# Patient Record
Sex: Female | Born: 1955 | Race: White | Hispanic: Yes | Marital: Single | Smoking: Never smoker
Health system: Southern US, Community
[De-identification: ages and names within clinical notes are randomized; demographics above are authoritative.]

## PROBLEM LIST (undated history)

## (undated) DIAGNOSIS — I10 Essential (primary) hypertension: Secondary | ICD-10-CM

## (undated) DIAGNOSIS — H409 Unspecified glaucoma: Secondary | ICD-10-CM

## (undated) DIAGNOSIS — Z87898 Personal history of other specified conditions: Secondary | ICD-10-CM

## (undated) DIAGNOSIS — E785 Hyperlipidemia, unspecified: Secondary | ICD-10-CM

## (undated) HISTORY — PX: EYE SURGERY: SHX253

## (undated) HISTORY — DX: Essential (primary) hypertension: I10

## (undated) HISTORY — DX: Hyperlipidemia, unspecified: E78.5

## (undated) HISTORY — PX: OVARIAN CYST REMOVAL: SHX89

## (undated) HISTORY — DX: Personal history of other specified conditions: Z87.898

## (undated) HISTORY — PX: TUBAL LIGATION: SHX77

## (undated) HISTORY — DX: Unspecified glaucoma: H40.9

---

## 1999-09-13 ENCOUNTER — Encounter: Payer: Self-pay | Admitting: Family Medicine

## 1999-09-13 ENCOUNTER — Ambulatory Visit (HOSPITAL_COMMUNITY): Admission: RE | Admit: 1999-09-13 | Discharge: 1999-09-13 | Payer: Self-pay | Admitting: Family Medicine

## 1999-09-16 ENCOUNTER — Ambulatory Visit (HOSPITAL_COMMUNITY): Admission: RE | Admit: 1999-09-16 | Discharge: 1999-09-16 | Payer: Self-pay | Admitting: Family Medicine

## 2001-08-03 ENCOUNTER — Ambulatory Visit: Admission: RE | Admit: 2001-08-03 | Discharge: 2001-08-03 | Payer: Self-pay | Admitting: Emergency Medicine

## 2003-01-04 ENCOUNTER — Ambulatory Visit (HOSPITAL_COMMUNITY): Admission: RE | Admit: 2003-01-04 | Discharge: 2003-01-04 | Payer: Self-pay | Admitting: Family Medicine

## 2003-02-01 ENCOUNTER — Encounter: Payer: Self-pay | Admitting: Family Medicine

## 2003-02-01 ENCOUNTER — Ambulatory Visit (HOSPITAL_COMMUNITY): Admission: RE | Admit: 2003-02-01 | Discharge: 2003-02-01 | Payer: Self-pay | Admitting: Family Medicine

## 2003-06-12 ENCOUNTER — Ambulatory Visit (HOSPITAL_COMMUNITY): Admission: RE | Admit: 2003-06-12 | Discharge: 2003-06-12 | Payer: Self-pay | Admitting: Urology

## 2004-02-16 ENCOUNTER — Ambulatory Visit: Payer: Self-pay | Admitting: *Deleted

## 2004-02-21 ENCOUNTER — Ambulatory Visit: Payer: Self-pay | Admitting: Internal Medicine

## 2004-03-12 ENCOUNTER — Ambulatory Visit: Payer: Self-pay | Admitting: Family Medicine

## 2004-03-22 ENCOUNTER — Ambulatory Visit (HOSPITAL_COMMUNITY): Admission: RE | Admit: 2004-03-22 | Discharge: 2004-03-22 | Payer: Self-pay | Admitting: Family Medicine

## 2004-04-02 ENCOUNTER — Ambulatory Visit: Payer: Self-pay | Admitting: Internal Medicine

## 2004-08-20 ENCOUNTER — Ambulatory Visit: Payer: Self-pay | Admitting: Family Medicine

## 2004-08-22 ENCOUNTER — Ambulatory Visit: Payer: Self-pay | Admitting: Family Medicine

## 2005-01-31 ENCOUNTER — Ambulatory Visit: Payer: Self-pay | Admitting: Family Medicine

## 2005-01-31 ENCOUNTER — Ambulatory Visit (HOSPITAL_COMMUNITY): Admission: RE | Admit: 2005-01-31 | Discharge: 2005-01-31 | Payer: Self-pay | Admitting: Internal Medicine

## 2005-06-09 ENCOUNTER — Ambulatory Visit: Payer: Self-pay | Admitting: Family Medicine

## 2005-07-01 ENCOUNTER — Ambulatory Visit: Payer: Self-pay | Admitting: Family Medicine

## 2005-07-09 ENCOUNTER — Ambulatory Visit: Payer: Self-pay | Admitting: Family Medicine

## 2005-11-11 ENCOUNTER — Ambulatory Visit: Payer: Self-pay | Admitting: Family Medicine

## 2006-03-17 ENCOUNTER — Ambulatory Visit: Payer: Self-pay | Admitting: Family Medicine

## 2006-04-28 ENCOUNTER — Encounter (INDEPENDENT_AMBULATORY_CARE_PROVIDER_SITE_OTHER): Payer: Self-pay | Admitting: Family Medicine

## 2006-04-28 ENCOUNTER — Ambulatory Visit: Payer: Self-pay | Admitting: Family Medicine

## 2006-05-01 ENCOUNTER — Ambulatory Visit (HOSPITAL_COMMUNITY): Admission: RE | Admit: 2006-05-01 | Discharge: 2006-05-01 | Payer: Self-pay | Admitting: Family Medicine

## 2006-06-05 ENCOUNTER — Ambulatory Visit: Payer: Self-pay | Admitting: Internal Medicine

## 2006-06-25 ENCOUNTER — Ambulatory Visit: Payer: Self-pay | Admitting: Internal Medicine

## 2006-08-03 ENCOUNTER — Ambulatory Visit: Payer: Self-pay | Admitting: Family Medicine

## 2007-01-27 ENCOUNTER — Encounter (INDEPENDENT_AMBULATORY_CARE_PROVIDER_SITE_OTHER): Payer: Self-pay | Admitting: *Deleted

## 2007-05-21 ENCOUNTER — Ambulatory Visit: Payer: Self-pay | Admitting: Internal Medicine

## 2007-05-21 DIAGNOSIS — M25549 Pain in joints of unspecified hand: Secondary | ICD-10-CM

## 2007-05-21 DIAGNOSIS — M719 Bursopathy, unspecified: Secondary | ICD-10-CM

## 2007-05-21 DIAGNOSIS — M67919 Unspecified disorder of synovium and tendon, unspecified shoulder: Secondary | ICD-10-CM | POA: Insufficient documentation

## 2007-05-24 ENCOUNTER — Ambulatory Visit (HOSPITAL_COMMUNITY): Admission: RE | Admit: 2007-05-24 | Discharge: 2007-05-24 | Payer: Self-pay | Admitting: Internal Medicine

## 2007-05-24 ENCOUNTER — Ambulatory Visit (HOSPITAL_COMMUNITY): Admission: RE | Admit: 2007-05-24 | Discharge: 2007-05-24 | Payer: Self-pay | Admitting: Family Medicine

## 2007-07-05 ENCOUNTER — Encounter (INDEPENDENT_AMBULATORY_CARE_PROVIDER_SITE_OTHER): Payer: Self-pay | Admitting: Family Medicine

## 2007-07-05 ENCOUNTER — Ambulatory Visit: Payer: Self-pay | Admitting: Family Medicine

## 2007-07-05 DIAGNOSIS — I10 Essential (primary) hypertension: Secondary | ICD-10-CM | POA: Insufficient documentation

## 2007-07-05 LAB — CONVERTED CEMR LAB
AST: 17 units/L (ref 0–37)
Alkaline Phosphatase: 60 units/L (ref 39–117)
BUN: 15 mg/dL (ref 6–23)
Basophils Relative: 0 % (ref 0–1)
Calcium: 8.7 mg/dL (ref 8.4–10.5)
Chloride: 105 meq/L (ref 96–112)
Creatinine, Ser: 0.62 mg/dL (ref 0.40–1.20)
Eosinophils Absolute: 0.1 10*3/uL (ref 0.0–0.7)
Eosinophils Relative: 1 % (ref 0–5)
HDL: 47 mg/dL (ref >39)
Hemoglobin: 12.7 g/dL (ref 12.0–15.0)
MCHC: 32.6 g/dL (ref 30.0–36.0)
MCV: 87.4 fL (ref 78.0–100.0)
Monocytes Absolute: 0.6 10*3/uL (ref 0.1–1.0)
Monocytes Relative: 8 % (ref 3–12)
Pap Smear: NORMAL
RBC: 4.46 M/uL (ref 3.87–5.11)
TSH: 1.225 microintl units/mL (ref 0.350–5.50)
Total CHOL/HDL Ratio: 3.2

## 2008-03-21 ENCOUNTER — Ambulatory Visit: Payer: Self-pay | Admitting: Family Medicine

## 2008-09-25 ENCOUNTER — Ambulatory Visit: Payer: Self-pay | Admitting: *Deleted

## 2008-12-29 ENCOUNTER — Ambulatory Visit: Payer: Self-pay | Admitting: Physician Assistant

## 2008-12-29 DIAGNOSIS — R011 Cardiac murmur, unspecified: Secondary | ICD-10-CM

## 2009-01-04 ENCOUNTER — Ambulatory Visit (HOSPITAL_COMMUNITY): Admission: RE | Admit: 2009-01-04 | Discharge: 2009-01-04 | Payer: Self-pay | Admitting: Internal Medicine

## 2009-01-09 ENCOUNTER — Encounter (INDEPENDENT_AMBULATORY_CARE_PROVIDER_SITE_OTHER): Payer: Self-pay | Admitting: Internal Medicine

## 2009-01-09 ENCOUNTER — Ambulatory Visit (HOSPITAL_COMMUNITY): Admission: RE | Admit: 2009-01-09 | Discharge: 2009-01-09 | Payer: Self-pay | Admitting: Internal Medicine

## 2009-01-12 ENCOUNTER — Ambulatory Visit: Payer: Self-pay | Admitting: Physician Assistant

## 2009-02-02 ENCOUNTER — Ambulatory Visit: Payer: Self-pay | Admitting: Physician Assistant

## 2009-03-02 ENCOUNTER — Ambulatory Visit: Payer: Self-pay | Admitting: Physician Assistant

## 2009-03-02 DIAGNOSIS — R079 Chest pain, unspecified: Secondary | ICD-10-CM

## 2009-03-02 DIAGNOSIS — F329 Major depressive disorder, single episode, unspecified: Secondary | ICD-10-CM | POA: Insufficient documentation

## 2009-03-02 DIAGNOSIS — K219 Gastro-esophageal reflux disease without esophagitis: Secondary | ICD-10-CM

## 2009-03-03 ENCOUNTER — Encounter: Payer: Self-pay | Admitting: Physician Assistant

## 2009-03-06 ENCOUNTER — Ambulatory Visit: Payer: Self-pay | Admitting: Physician Assistant

## 2009-03-09 ENCOUNTER — Encounter: Payer: Self-pay | Admitting: Physician Assistant

## 2009-04-02 ENCOUNTER — Ambulatory Visit: Payer: Self-pay | Admitting: Physician Assistant

## 2009-05-01 ENCOUNTER — Encounter: Payer: Self-pay | Admitting: Cardiovascular Disease

## 2009-05-03 ENCOUNTER — Ambulatory Visit: Payer: Self-pay | Admitting: Physician Assistant

## 2009-05-18 ENCOUNTER — Emergency Department (HOSPITAL_COMMUNITY): Admission: EM | Admit: 2009-05-18 | Discharge: 2009-05-19 | Payer: Self-pay | Admitting: Emergency Medicine

## 2009-05-24 ENCOUNTER — Ambulatory Visit: Payer: Self-pay | Admitting: Physician Assistant

## 2009-05-24 DIAGNOSIS — Z87442 Personal history of urinary calculi: Secondary | ICD-10-CM

## 2009-05-25 ENCOUNTER — Telehealth: Payer: Self-pay | Admitting: Physician Assistant

## 2009-05-25 LAB — CONVERTED CEMR LAB
ALT: 10 units/L (ref 0–35)
AST: 17 units/L (ref 0–37)
CO2: 27 meq/L (ref 19–32)
Chloride: 103 meq/L (ref 96–112)
Sodium: 138 meq/L (ref 135–145)
Total Bilirubin: 0.4 mg/dL (ref 0.3–1.2)
Total Protein: 7 g/dL (ref 6.0–8.3)
Uric Acid, Serum: 4.5 mg/dL (ref 2.4–7.0)

## 2009-05-29 ENCOUNTER — Ambulatory Visit: Payer: Self-pay

## 2009-05-29 ENCOUNTER — Encounter: Payer: Self-pay | Admitting: Cardiovascular Disease

## 2009-05-29 ENCOUNTER — Ambulatory Visit: Payer: Self-pay | Admitting: Cardiology

## 2009-05-29 ENCOUNTER — Ambulatory Visit: Payer: Self-pay | Admitting: Cardiovascular Disease

## 2009-05-29 ENCOUNTER — Ambulatory Visit (HOSPITAL_COMMUNITY): Admission: RE | Admit: 2009-05-29 | Discharge: 2009-05-29 | Payer: Self-pay | Admitting: Cardiovascular Disease

## 2009-06-11 ENCOUNTER — Encounter: Payer: Self-pay | Admitting: Physician Assistant

## 2009-06-11 DIAGNOSIS — I080 Rheumatic disorders of both mitral and aortic valves: Secondary | ICD-10-CM | POA: Insufficient documentation

## 2009-06-14 ENCOUNTER — Ambulatory Visit: Payer: Self-pay | Admitting: Physician Assistant

## 2009-06-14 LAB — CONVERTED CEMR LAB
Ketones, urine, test strip: NEGATIVE
Nitrite: NEGATIVE
Urobilinogen, UA: 0.2
WBC Urine, dipstick: NEGATIVE

## 2009-06-15 ENCOUNTER — Encounter: Payer: Self-pay | Admitting: Physician Assistant

## 2009-06-15 LAB — CONVERTED CEMR LAB
Bacteria, UA: NONE SEEN
WBC, UA: NONE SEEN cells/hpf (ref <3)

## 2009-07-02 LAB — CONVERTED CEMR LAB
Creatinine 24 HR UR: 1244 mg/24hr (ref 700–1800)
Creatinine, Urine: 124.4 mg/dL
Uric Acid, 24H Ur: 604 mg/(24.h) (ref 250–750)

## 2009-07-06 ENCOUNTER — Ambulatory Visit: Payer: Self-pay | Admitting: Physician Assistant

## 2009-07-13 ENCOUNTER — Ambulatory Visit: Payer: Self-pay | Admitting: Physician Assistant

## 2009-07-13 LAB — CONVERTED CEMR LAB
Bacteria, UA: NONE SEEN
RBC / HPF: NONE SEEN (ref <3)

## 2009-07-14 ENCOUNTER — Encounter: Payer: Self-pay | Admitting: Physician Assistant

## 2009-07-16 ENCOUNTER — Encounter: Payer: Self-pay | Admitting: Physician Assistant

## 2009-07-16 LAB — CONVERTED CEMR LAB
Glucose, Urine, Semiquant: NEGATIVE
Nitrite: NEGATIVE
Protein, U semiquant: NEGATIVE
Specific Gravity, Urine: 1.025
Urobilinogen, UA: 0.2
WBC Urine, dipstick: NEGATIVE

## 2009-07-17 ENCOUNTER — Encounter (INDEPENDENT_AMBULATORY_CARE_PROVIDER_SITE_OTHER): Payer: Self-pay | Admitting: *Deleted

## 2009-08-03 ENCOUNTER — Ambulatory Visit: Payer: Self-pay | Admitting: Physician Assistant

## 2009-09-17 ENCOUNTER — Encounter: Payer: Self-pay | Admitting: Physician Assistant

## 2009-09-17 ENCOUNTER — Ambulatory Visit: Payer: Self-pay | Admitting: Physician Assistant

## 2009-09-17 DIAGNOSIS — J309 Allergic rhinitis, unspecified: Secondary | ICD-10-CM | POA: Insufficient documentation

## 2009-09-17 LAB — CONVERTED CEMR LAB
Bilirubin Urine: NEGATIVE
Glucose, Urine, Semiquant: NEGATIVE
Ketones, urine, test strip: NEGATIVE
Nitrite: NEGATIVE
pH: 5

## 2009-09-18 DIAGNOSIS — E876 Hypokalemia: Secondary | ICD-10-CM

## 2009-09-18 LAB — CONVERTED CEMR LAB
CO2: 21 meq/L (ref 19–32)
Calcium: 9.3 mg/dL (ref 8.4–10.5)
Casts: NONE SEEN /lpf
Glucose, Bld: 89 mg/dL (ref 70–99)
Potassium: 3.4 meq/L — ABNORMAL LOW (ref 3.5–5.3)
RBC / HPF: NONE SEEN (ref <3)
Sodium: 136 meq/L (ref 135–145)
WBC, UA: NONE SEEN cells/hpf (ref <3)

## 2009-10-04 ENCOUNTER — Ambulatory Visit: Payer: Self-pay | Admitting: Physician Assistant

## 2009-10-09 ENCOUNTER — Encounter: Payer: Self-pay | Admitting: Physician Assistant

## 2009-10-09 LAB — CONVERTED CEMR LAB
BUN: 21 mg/dL (ref 6–23)
Calcium: 8.5 mg/dL (ref 8.4–10.5)
Glucose, Bld: 100 mg/dL — ABNORMAL HIGH (ref 70–99)
Potassium: 4.6 meq/L (ref 3.5–5.3)
Sodium: 138 meq/L (ref 135–145)

## 2009-10-15 ENCOUNTER — Encounter: Payer: Self-pay | Admitting: Physician Assistant

## 2009-12-28 ENCOUNTER — Encounter (INDEPENDENT_AMBULATORY_CARE_PROVIDER_SITE_OTHER): Payer: Self-pay | Admitting: *Deleted

## 2009-12-30 ENCOUNTER — Telehealth: Payer: Self-pay | Admitting: Physician Assistant

## 2010-01-28 ENCOUNTER — Telehealth: Payer: Self-pay | Admitting: Physician Assistant

## 2010-02-08 ENCOUNTER — Ambulatory Visit: Payer: Self-pay | Admitting: Physician Assistant

## 2010-02-08 ENCOUNTER — Ambulatory Visit (HOSPITAL_COMMUNITY): Admission: RE | Admit: 2010-02-08 | Discharge: 2010-02-08 | Payer: Self-pay | Admitting: Internal Medicine

## 2010-02-13 ENCOUNTER — Ambulatory Visit (HOSPITAL_COMMUNITY): Admission: RE | Admit: 2010-02-13 | Discharge: 2010-02-13 | Payer: Self-pay | Admitting: Gastroenterology

## 2010-04-22 ENCOUNTER — Encounter (INDEPENDENT_AMBULATORY_CARE_PROVIDER_SITE_OTHER): Payer: Self-pay | Admitting: Nurse Practitioner

## 2010-04-22 ENCOUNTER — Ambulatory Visit: Payer: Self-pay | Admitting: Nurse Practitioner

## 2010-04-22 LAB — CONVERTED CEMR LAB
Bilirubin Urine: NEGATIVE
Glucose, Urine, Semiquant: NEGATIVE
KOH Prep: NEGATIVE
Ketones, urine, test strip: NEGATIVE
Protein, U semiquant: NEGATIVE
Specific Gravity, Urine: 1.02
WBC Urine, dipstick: NEGATIVE
pH: 6

## 2010-04-23 LAB — CONVERTED CEMR LAB
ALT: 13 units/L (ref 0–35)
Albumin: 4 g/dL (ref 3.5–5.2)
Alkaline Phosphatase: 49 units/L (ref 39–117)
Basophils Absolute: 0 10*3/uL (ref 0.0–0.1)
CO2: 27 meq/L (ref 19–32)
Chlamydia, DNA Probe: NEGATIVE
Cholesterol: 188 mg/dL (ref 0–200)
Eosinophils Relative: 2 % (ref 0–5)
GC Probe Amp, Genital: NEGATIVE
Glucose, Bld: 89 mg/dL (ref 70–99)
LDL Cholesterol: 115 mg/dL — ABNORMAL HIGH (ref 0–99)
Lymphocytes Relative: 31 % (ref 12–46)
Lymphs Abs: 2 10*3/uL (ref 0.7–4.0)
Microalb, Ur: 2.54 mg/dL — ABNORMAL HIGH (ref 0.00–1.89)
Neutrophils Relative %: 61 % (ref 43–77)
Platelets: 209 10*3/uL (ref 150–400)
Potassium: 3.7 meq/L (ref 3.5–5.3)
RDW: 14.3 % (ref 11.5–15.5)
Sodium: 141 meq/L (ref 135–145)
Total Bilirubin: 0.6 mg/dL (ref 0.3–1.2)
Total Protein: 7 g/dL (ref 6.0–8.3)
Triglycerides: 134 mg/dL (ref <150)
VLDL: 27 mg/dL (ref 0–40)
WBC: 6.6 10*3/uL (ref 4.0–10.5)

## 2010-04-25 ENCOUNTER — Encounter (INDEPENDENT_AMBULATORY_CARE_PROVIDER_SITE_OTHER): Payer: Self-pay | Admitting: *Deleted

## 2010-04-25 ENCOUNTER — Encounter (INDEPENDENT_AMBULATORY_CARE_PROVIDER_SITE_OTHER): Payer: Self-pay | Admitting: Nurse Practitioner

## 2010-04-25 LAB — CONVERTED CEMR LAB: Pap Smear: NEGATIVE

## 2010-05-03 ENCOUNTER — Encounter (INDEPENDENT_AMBULATORY_CARE_PROVIDER_SITE_OTHER): Payer: Self-pay | Admitting: *Deleted

## 2010-06-09 LAB — CONVERTED CEMR LAB
AST: 17 units/L (ref 0–37)
Alkaline Phosphatase: 48 units/L (ref 39–117)
BUN: 18 mg/dL (ref 6–23)
Basophils Absolute: 0 10*3/uL (ref 0.0–0.1)
Basophils Relative: 0 % (ref 0–1)
Bilirubin Urine: NEGATIVE
CO2: 25 meq/L (ref 19–32)
Calcium: 8.7 mg/dL (ref 8.4–10.5)
Calcium: 9.2 mg/dL (ref 8.4–10.5)
Creatinine, Ser: 0.6 mg/dL (ref 0.40–1.20)
Creatinine, Ser: 0.68 mg/dL (ref 0.40–1.20)
Eosinophils Absolute: 0.1 10*3/uL (ref 0.0–0.7)
Eosinophils Relative: 1 % (ref 0–5)
GC Probe Amp, Genital: NEGATIVE
Glucose, Bld: 76 mg/dL (ref 70–99)
HCT: 37.2 % (ref 36.0–46.0)
MCHC: 32.8 g/dL (ref 30.0–36.0)
MCV: 88.2 fL (ref 78.0–100.0)
Nitrite: NEGATIVE
Platelets: 242 10*3/uL (ref 150–400)
RDW: 14.5 % (ref 11.5–15.5)
Specific Gravity, Urine: >=1.03
Urobilinogen, UA: 0.2
WBC Urine, dipstick: NEGATIVE
pH: 5

## 2010-06-11 NOTE — Assessment & Plan Note (Signed)
Summary: HTN; Depression; GERD   Vital Signs:  Patient profile:   55 year old female Height:      59.25 inches Weight:      150 pounds BMI:     30.15 Temp:     97.0 degrees F oral Pulse rate:   72 / minute Pulse rhythm:   regular Resp:     18 per minute BP sitting:   100 / 60  (left arm) Cuff size:   large  Vitals Entered By: [name] (February 08, 2010 11:08 AM) CC: meds review.... med refill, Hypertension Management, Abdominal Pain, Depression Is Patient Diabetic? No Pain Assessment Patient in pain? no       Does patient need assistance? Functional Status Self care Ambulation Normal   CC:  meds review.... med refill, Hypertension Management, Abdominal Pain, and Depression.  Depression History:      The patient comes in today for a maintenance visit due to her depression.  The patient denies a depressed mood most of the day and a diminished interest in her usual daily activities.  The patient denies recurrent thoughts of death or suicide.         Dyspepsia History:      She has no alarm features of dyspepsia including no history of melena, hematochezia, dysphagia, persistent vomiting, or involuntary weight loss > 5%.  There is a prior history of GERD.  An H-2 blocker medication is currently being taken.    Hypertension History:      She complains of headache, but denies chest pain, dyspnea with exertion, and syncope.  She notes no problems with any antihypertensive medication side effects.  Further comments include: Patient still taking cardura.  Had stated she stopped a long time ago, but still taking. BP ok without SEs.        Positive major cardiovascular risk factors include hypertension.  Negative major cardiovascular risk factors include female age less than 78 years old and non-tobacco-user status.      Current Medications (verified): 1)  Benazepril-Hydrochlorothiazide 20-25 Mg  Tabs (Benazepril-Hydrochlorothiazide) .Marland Kitchen.. 1 Tab By Mouth Every Morning 2)   Ibuprofen 600 Mg  Tabs (Ibuprofen) .Marland Kitchen.. 1 Tab By Mouth Three Times A Day With Meals As Needed. 3)  Pepcid 20 Mg Tabs (Famotidine) .... Take 1 Tablet By Mouth Two Times A Day For Indigestion 4)  Caltrate 600+d Plus 600-400 Mg-Unit Tabs (Calcium Carbonate-Vit D-Min) .... Take 1 Tablet By Mouth Two Times A Day 5)  Nitrostat 0.4 Mg Subl (Nitroglycerin) .Marland Kitchen.. 1 Sublingual As Needed For Chest Pain.  Repeat Q 5 Mins. As Need.  Call 911 Before 3 Rd Dose.  Sit Down To Take. 6)  Aspir-Low 81 Mg Tbec (Aspirin) .... Once Daily 7)  Calan Sr 240 Mg Cr-Tabs (Verapamil Hcl) .... Take 1 Tablet By Mouth Once A Day 8)  Oxycodone-Acetaminophen 5-325 Mg Tabs (Oxycodone-Acetaminophen) .... One or Two Tabs As Needed For Pain 9)  Visine 0.05 % Soln (Tetrahydrozoline Hcl) .Marland Kitchen.. 1-2 Drops in Each Eye Every 6-8 Hours As Needed 10)  Miralax  Powd (Polyethylene Glycol 3350) .... As Directed For Colonoscopy Prep  Allergies (verified): No Known Drug Allergies  Physical Exam  General:  alert, well-developed, and well-nourished.   Head:  normocephalic and atraumatic.   Neck:  supple.   Lungs:  normal breath sounds.   Heart:  normal rate and regular rhythm.   Abdomen:  soft and non-tender.   Neurologic:  alert & oriented X3 and cranial nerves II-XII intact.  Psych:  normally interactive.     Impression & Recommendations:  Problem # 1:  HYPERTENSION (ICD-401.9) controlled  Her updated medication list for this problem includes:    Benazepril-hydrochlorothiazide 20-25 Mg Tabs (Benazepril-hydrochlorothiazide) .Marland Kitchen... 1 tab by mouth every morning    Calan Sr 240 Mg Cr-tabs (Verapamil hcl) .Marland Kitchen... Take 1 tablet by mouth once a day    Cardura 2 Mg Tabs (Doxazosin mesylate) .Marland Kitchen... Take 1 tablet by mouth once a day  Problem # 2:  MITRAL REGURGITATION, MILD (ICD-396.3) needs repeat echo in 05/2010  Her updated medication list for this problem includes:    Aspir-low 81 Mg Tbec (Aspirin) ..... Once daily  Problem # 3:   DEPRESSION, MILD (ICD-311) Assessment: Improved not taking zoloft as directed rec she stop altogether  Problem # 4:  GERD (ICD-530.81) stable  Her updated medication list for this problem includes:    Pepcid 20 Mg Tabs (Famotidine) .Marland Kitchen... Take 1 tablet by mouth two times a day for indigestion  Problem # 5:  EXAMINATION, ROUTINE MEDICAL (ICD-V70.0) has appt for colo next week   Complete Medication List: 1)  Benazepril-hydrochlorothiazide 20-25 Mg Tabs (Benazepril-hydrochlorothiazide) .Marland Kitchen.. 1 tab by mouth every morning 2)  Ibuprofen 600 Mg Tabs (Ibuprofen) .Marland Kitchen.. 1 tab by mouth three times a day with meals as needed. 3)  Pepcid 20 Mg Tabs (Famotidine) .... Take 1 tablet by mouth two times a day for indigestion 4)  Caltrate 600+d Plus 600-400 Mg-unit Tabs (Calcium carbonate-vit d-min) .... Take 1 tablet by mouth two times a day 5)  Nitrostat 0.4 Mg Subl (Nitroglycerin) .Marland Kitchen.. 1 sublingual as needed for chest pain.  repeat q 5 mins. as need.  call 911 before 3 rd dose.  sit down to take. 6)  Aspir-low 81 Mg Tbec (Aspirin) .... Once daily 7)  Calan Sr 240 Mg Cr-tabs (Verapamil hcl) .... Take 1 tablet by mouth once a day 8)  Visine 0.05 % Soln (Tetrahydrozoline hcl) .Marland Kitchen.. 1-2 drops in each eye every 6-8 hours as needed 9)  Miralax Powd (Polyethylene glycol 3350) .... As directed for colonoscopy prep 10)  Cardura 2 Mg Tabs (Doxazosin mesylate) .... Take 1 tablet by mouth once a day  Depression Assessment/Plan: Assessment of Depressive Symptoms:  Complete Response Treatment Comments:  Only taking zoloft as needed.  Not taking very much.  Advised her to stop it altogether.  Hypertension Assessment/Plan:      The patient's hypertensive risk group is category A: No risk factors and no target organ damage.  Her calculated 10 year risk of coronary heart disease is 3 %.  Today's blood pressure is 100/60.  Her blood pressure goal is < 140/90.   Patient Instructions: 1)  Please schedule a follow-up  appointment in 2-3 months for CPP.

## 2010-06-11 NOTE — Progress Notes (Signed)
Summary: REFILLS on Benazepril and Calan SR  Phone Note Call from Patient Call back at Home Phone [phone]   Reason for Call: Refill Medication Summary of Call: [facility] PT. PATIENT NEEDS REFILLS  ON BENAZEPRIL AND CALAN SR. SHE USES [facility]. Initial call taken by: Leodis Rains,  January 28, 2010 3:53 PM  Follow-up for Phone Call        Do you want her Benazepril and Calan SR refilled?  Last seen 09/17/09. Follow-up by: Dutch Quint RN,  January 29, 2010 10:07 AM  Additional Follow-up for Phone Call Additional follow up Details #1::        Pt. notified of refills.  Dutch Quint RN  January 29, 2010 2:09 PM     Prescriptions: CALAN SR 240 MG CR-TABS (VERAPAMIL HCL) Take 1 tablet by mouth once a day  #30 x 6   Entered and Authorized by:   Tereso Newcomer PA-C   Signed by:   Tereso Newcomer PA-C on 01/29/2010   Method used:   Faxed to ...       [facility] (retail)       [address] [place], [place]  [address]       Ph: [phone]       Fax: [phone]   RxID:   [id number] BENAZEPRIL-HYDROCHLOROTHIAZIDE 20-25 MG  TABS (BENAZEPRIL-HYDROCHLOROTHIAZIDE) 1 tab by mouth every morning  #30 x 6   Entered and Authorized by:   Tereso Newcomer PA-C   Signed by:   Tereso Newcomer PA-C on 01/29/2010   Method used:   Faxed to ...       [facility] (retail)       [address] [place], [place]  [address]       Ph: [phone]       Fax: [phone]   RxID:   [id number]

## 2010-06-11 NOTE — Progress Notes (Signed)
Summary: Office Visit//DEPRESSION SCREENING  Office Visit//DEPRESSION SCREENING   Imported By: Arta Bruce 11/13/2009 15:24:02  _____________________________________________________________________  External Attachment:    Type:   Image     Comment:   External Document

## 2010-06-11 NOTE — Progress Notes (Signed)
Summary: Colonoscopy Prescription   Phone Note Outgoing Call   Summary of Call: [facility] could not contact Susan Russell. Please get in touch with her. See if we can get her in with Dr. Doreatha Martin for screening colo. Initial call taken by: Brynda Rim,  December 30, 2009 10:02 PM  Follow-up for Phone Call        Left message on answering machine for pt to call back..[name]  December 31, 2009 5:06 PM  Will forward to Arna Medici to contact Pt. Gaylyn Cheers RN  January 01, 2010 10:56 AM    Additional Follow-up for Phone Call Additional follow up Details #1::        I spoke with pt today and I schedule her an appt with Dr Victorino Dike 02-06-10 @ 1pm Pt aware of the appt  She is going to  pass by here to pick up RX  Additional Follow-up by: Cheryll Dessert,  January 02, 2010 4:38 PM    Additional Follow-up for Phone Call Additional follow up Details #2::    in [place]'s basket Tereso Newcomer PA-C  January 04, 2010 2:34 PM   sent script to according.... [name]  January 08, 2010 9:20 AM   New/Updated Medications: MIRALAX  POWD (POLYETHYLENE GLYCOL 3350) as directed for colonoscopy prep Prescriptions: MIRALAX  POWD (POLYETHYLENE GLYCOL 3350) as directed for colonoscopy prep  #1 bottle x 0   Entered and Authorized by:   Tereso Newcomer PA-C   Signed by:   Tereso Newcomer PA-C on 01/04/2010   Method used:   Print then Give to Patient   RxID:   [id number]

## 2010-06-11 NOTE — Progress Notes (Signed)
  Phone Note Outgoing Call   Summary of Call: Please call patient to confirm that she is taking both verapamil and norvasc. If so, tell her to stop the norvasc.  I spoke with the heart doctor.  He did not want her to be on both meds. She should stay on the verapamil. Initial call taken by: Tereso Newcomer PA-C,  May 25, 2009 1:51 PM  Follow-up for Phone Call        graceila Left message on answering machine for pt to call back...Marland KitchenMarland KitchenArmenia Shannon  May 25, 2009 3:38 PM   Thanks. Follow-up by: Tereso Newcomer PA-C,  May 29, 2009 1:51 PM  Additional Follow-up for Phone Call Additional follow up Details #1::        spoke with pt and she will stop taking med today Additional Follow-up by: [name],  June 04, 2009 2:48 PM

## 2010-06-11 NOTE — Miscellaneous (Signed)
Summary: Needs repeat Echo 05/2010  Clinical Lists Changes  Problems: Added new problem of MITRAL REGURGITATION, MILD (ICD-396.3) Assessed MITRAL REGURGITATION, MILD as comment only - needs f/u Echo in 1 year; q 2-3 years after  if no change  Her updated medication list for this problem includes:    Aspir-low 81 Mg Tbec (Aspirin) ..... Once daily  Observations: Added new observation of PAST MED HX: CHEST PAIN UNSPECIFIED (ICD-786.50) HYPERTENSION (ICD-401.9) MURMUR (ICD-785.2)   a.  mild MR/AI . Marland Kitchen Marland Kitchen needs repeat 05/2010 DEPRESSION, MILD (ICD-311) GERD (ICD-530.81) ROTATOR CUFF SYNDROME, LEFT (ICD-726.10) PAIN IN JOINT, HAND (ICD-719.44) EXAMINATION, ROUTINE MEDICAL (ICD-V70.0) SCREENING FOR MALIGNANT NEOPLASM, CERVIX (ICD-V76.2)    (06/11/2009 15:56)       Past History:  Past Medical History: CHEST PAIN UNSPECIFIED (ICD-786.50) HYPERTENSION (ICD-401.9) MURMUR (ICD-785.2)   a.  mild MR/AI . Marland Kitchen Marland Kitchen needs repeat 05/2010 DEPRESSION, MILD (ICD-311) GERD (ICD-530.81) ROTATOR CUFF SYNDROME, LEFT (ICD-726.10) PAIN IN JOINT, HAND (ICD-719.44) EXAMINATION, ROUTINE MEDICAL (ICD-V70.0) SCREENING FOR MALIGNANT NEOPLASM, CERVIX (ICD-V76.2)     Impression & Recommendations:  Problem # 1:  MITRAL REGURGITATION, MILD (ICD-396.3) needs f/u Echo in 1 year; q 2-3 years after  if no change  Her updated medication list for this problem includes:    Aspir-low 81 Mg Tbec (Aspirin) ..... Once daily  Complete Medication List: 1)  Benazepril-hydrochlorothiazide 20-25 Mg Tabs (Benazepril-hydrochlorothiazide) .Marland Kitchen.. 1 tab by mouth every morning 2)  Ibuprofen 600 Mg Tabs (Ibuprofen) .Marland Kitchen.. 1 tab by mouth three times a day with meals as needed. 3)  Pepcid 20 Mg Tabs (Famotidine) .... Take 1 tablet by mouth two times a day for indigestion 4)  Caltrate 600+d Plus 600-400 Mg-unit Tabs (Calcium carbonate-vit d-min) .... Take 1 tablet by mouth two times a day 5)  Nitrostat 0.4 Mg Subl (Nitroglycerin)  .Marland Kitchen.. 1 sublingual as needed for chest pain.  repeat q 5 mins. as need.  call 911 before 3 rd dose.  sit down to take. 6)  Aspir-low 81 Mg Tbec (Aspirin) .... Once daily 7)  Zoloft 50 Mg Tabs (Sertraline hcl) .... Take 1/2 at bedtime 8)  Calan Sr 240 Mg Cr-tabs (Verapamil hcl) .... Take 1 tablet by mouth once a day 9)  Oxycodone-acetaminophen 5-325 Mg Tabs (Oxycodone-acetaminophen) .... One or two tabs as needed for pain 10)  Ciprofloxacin Hcl 500 Mg Tabs (Ciprofloxacin hcl) 11)  Ondansetron Hcl 4 Mg Tabs (Ondansetron hcl) 12)  Clonidine Hcl 0.2 Mg Tabs (Clonidine hcl) .... Take 1 tablet by mouth two times a day 13)  Cardura 2 Mg Tabs (Doxazosin mesylate) .... Take 1 tab by mouth at bedtime

## 2010-06-11 NOTE — Assessment & Plan Note (Signed)
Summary: f/u stress echo done at 8:30   Visit Type:  Follow-up Primary Provider:  Tereso Newcomer PA-C  CC:  pt had stress echo today...pt had some chest discomfort while on treadmill today.. little sob..no edema.  History of Present Illness: 55 yo Hispanic female with h/o HTN here today for follow up. She was seen several weeks ago for evaluation of chest pain.  She told me  that sometimes when working she feels a heaviness in her chest that lasts five minutes. No associated SOB, dizziness, near syncope, syncope, diaphoresis. Has been occuring for last three months. She had an echo in August 2010 at [facility] that showed normal LV systolic function with diastolic dysfunction. She has had problems over the last few years with HTN and was and has been followed by Tereso Newcomer at [facility].   She is here for follow up and tells me that she has had no recurrence of chest pain. Communication is through an interpretor. Her son is also present with her today. She underwent a stress echo this am and had no ischemic ekg changes and no sign of ischemia on stress images. No other complaints.    Current Medications (verified): 1)  Benazepril-Hydrochlorothiazide 20-25 Mg  Tabs (Benazepril-Hydrochlorothiazide) .Marland Kitchen.. 1 Tab By Mouth Every Morning 2)  Ibuprofen 600 Mg  Tabs (Ibuprofen) .Marland Kitchen.. 1 Tab By Mouth Three Times A Day With Meals As Needed. 3)  Pepcid 20 Mg Tabs (Famotidine) .... Take 1 Tablet By Mouth Two Times A Day For Indigestion 4)  Caltrate 600+d Plus 600-400 Mg-Unit Tabs (Calcium Carbonate-Vit D-Min) .... Take 1 Tablet By Mouth Two Times A Day 5)  Nitrostat 0.4 Mg Subl (Nitroglycerin) .Marland Kitchen.. 1 Sublingual As Needed For Chest Pain.  Repeat Q 5 Mins. As Need.  Call 911 Before 3 Rd Dose.  Sit Down To Take. 6)  Aspir-Low 81 Mg Tbec (Aspirin) .... Once Daily 7)  Zoloft 50 Mg Tabs (Sertraline Hcl) .... Take 1/2 At Bedtime 8)  Calan Sr 240 Mg Cr-Tabs (Verapamil Hcl) .... Take 1 Tablet By Mouth Once A  Day 9)  Amlodipine Besylate 10 Mg Tabs (Amlodipine Besylate) .... Take One Tablet By Mouth Daily 10)  Oxycodone-Acetaminophen 5-325 Mg Tabs (Oxycodone-Acetaminophen) .... One or Two Tabs As Needed For Pain 11)  Ciprofloxacin Hcl 500 Mg Tabs (Ciprofloxacin Hcl) 12)  Ondansetron Hcl 4 Mg Tabs (Ondansetron Hcl) 13)  Clonidine Hcl 0.2 Mg Tabs (Clonidine Hcl) .... Take 1 Tablet By Mouth Two Times A Day 14)  Cardura 2 Mg Tabs (Doxazosin Mesylate) .... Take 1 Tab By Mouth At Bedtime  Allergies (verified): No Known Drug Allergies  Past History:  Past Medical History: Reviewed history from 04/27/2009 and no changes required. CHEST PAIN UNSPECIFIED (ICD-786.50) HYPERTENSION (ICD-401.9) MURMUR (ICD-785.2) DEPRESSION, MILD (ICD-311) GERD (ICD-530.81) ROTATOR CUFF SYNDROME, LEFT (ICD-726.10) PAIN IN JOINT, HAND (ICD-719.44) EXAMINATION, ROUTINE MEDICAL (ICD-V70.0) SCREENING FOR MALIGNANT NEOPLASM, CERVIX (ICD-V76.2)    Social History: Reviewed history from 07/05/2007 and no changes required. Single Has sexual partner x 2 years  Occupation:housekeeping for businesses  Former Smoker(back in 20's) Alcohol use-no Drug use-no  Review of Systems  The patient denies fatigue, malaise, fever, weight gain/loss, vision loss, decreased hearing, hoarseness, chest pain, palpitations, shortness of breath, prolonged cough, wheezing, sleep apnea, coughing up blood, abdominal pain, blood in stool, nausea, vomiting, diarrhea, heartburn, incontinence, blood in urine, muscle weakness, joint pain, leg swelling, rash, skin lesions, headache, fainting, dizziness, depression, anxiety, enlarged lymph nodes, easy bruising or bleeding, and environmental allergies.  Vital Signs:  Patient profile:   55 year old female Height:      59.25 inches Weight:      151 pounds Pulse rate:   66 / minute Pulse rhythm:   regular BP sitting:   114 / 80  (left arm) Cuff size:   large  Vitals Entered By: Danielle Rankin, CMA  (May 29, 2009 9:51 AM)  Physical Exam  General:  General: Well developed, well nourished, NAD Psychiatric: Mood and affect normal Neck: No JVD, no carotid bruits, no thyromegaly, no lymphadenopathy. Lungs:Clear bilaterally, no wheezes, rhonci, crackles CV: RRR no murmurs, gallops rubs Abdomen: soft, NT, ND, BS present Extremities: No edema, pulses 2+.    Stress Echocardiogram  Procedure date:  05/29/2009  Findings:      Pt exercised for 6 min 24 seconds. Target heart rate was achieved. No ischemic EKG changes. Rest EF of 60%. Stress EF 75%. No WMA with exercise.   Normal Stress Echo  Impression & Recommendations:  Problem # 1:  CHEST PAIN UNSPECIFIED (ICD-786.50) No evidence of ischemia on stress echo. Mostl likely non-cardiac chest pain. No further cardiac workup at this time. She will need repeat echo in one year to follow the mild MR/AI. If no change, then echo every 2-3 years. We will ask that this be arranged through the [facility] clinic. I will see her back as needed.   The following medications were removed from the medication list:    Amlodipine Besylate 10 Mg Tabs (Amlodipine besylate) .Marland Kitchen... Take one tablet by mouth daily Her updated medication list for this problem includes:    Benazepril-hydrochlorothiazide 20-25 Mg Tabs (Benazepril-hydrochlorothiazide) .Marland Kitchen... 1 tab by mouth every morning    Nitrostat 0.4 Mg Subl (Nitroglycerin) .Marland Kitchen... 1 sublingual as needed for chest pain.  repeat q 5 mins. as need.  call 911 before 3 rd dose.  sit down to take.    Aspir-low 81 Mg Tbec (Aspirin) ..... Once daily    Calan Sr 240 Mg Cr-tabs (Verapamil hcl) .Marland Kitchen... Take 1 tablet by mouth once a day  Problem # 2:  HYPERTENSION (ICD-401.9) Will have her stop Norvasc. Please see phone notes. She is also on Calan. Will continue Calan.   The following medications were removed from the medication list:    Amlodipine Besylate 10 Mg Tabs (Amlodipine besylate) .Marland Kitchen... Take one tablet by mouth  daily Her updated medication list for this problem includes:    Benazepril-hydrochlorothiazide 20-25 Mg Tabs (Benazepril-hydrochlorothiazide) .Marland Kitchen... 1 tab by mouth every morning    Aspir-low 81 Mg Tbec (Aspirin) ..... Once daily    Calan Sr 240 Mg Cr-tabs (Verapamil hcl) .Marland Kitchen... Take 1 tablet by mouth once a day    Clonidine Hcl 0.2 Mg Tabs (Clonidine hcl) .Marland Kitchen... Take 1 tablet by mouth two times a day    Cardura 2 Mg Tabs (Doxazosin mesylate) .Marland Kitchen... Take 1 tab by mouth at bedtime  Patient Instructions: 1)  Your physician recommends that you schedule a follow-up appointment as needed 2)  Your physician has recommended you make the following change in your medication: Stop norvasc (amlodipine)

## 2010-06-11 NOTE — Assessment & Plan Note (Signed)
Summary: folow up ap in 3 weeks for kidney stones/gk   Vital Signs:  Patient profile:   55 year old female Height:      59.25 inches Weight:      148 pounds BMI:     29.75 Temp:     97.4 degrees F oral Pulse rate:   69 / minute Pulse rhythm:   regular Resp:     18 per minute BP sitting:   146 / 80  (left arm) Cuff size:   large  Vitals Entered By: [name] (June 14, 2009 10:32 AM)  Serial Vital Signs/Assessments:  Time      Position  BP       Pulse  Resp  Temp     By 11:13 AM            120/70                         Tereso Newcomer PA-C 11:14 AM            130/70                         Tereso Newcomer PA-C  Comments: right arm By: Tereso Newcomer PA-C  11:14 AM left arm By: Tereso Newcomer PA-C   CC: f/u on kidney stones..., Hypertension Management Is Patient Diabetic? No Pain Assessment Patient in pain? no       Does patient need assistance? Functional Status Self care Ambulation Normal   Primary Care Provider:  Tereso Newcomer PA-C  CC:  f/u on kidney stones... and Hypertension Management.  History of Present Illness: 55 year old female returns for followup.  Since I last saw her, she has seen a cardiologist.  Her stress echocardiogram demonstrated no ischemia.  She had good LV function.  She had mild mitral regurgitation.  She requires a followup echocardiogram in one year.  She has not had any further chest pain.  Her left flank pain as improved.  She has finished the antibiotics.  She denies any dysuria or pain radiating into her groin.  She denies any hematuria.  We did lab work at last visit and her metabolic panel and uric acid were both normal.  Her 24-hour urine is currently pending.  Her mood is stable with her depression.  She cut back on her Zoloft and is having less problems with sleep now.  She is seeing a Veterinary surgeon.  She denies any suicidal ideations    Hypertension History:      She denies chest pain, dyspnea with exertion, and syncope.  She  notes no problems with any antihypertensive medication side effects.        Positive major cardiovascular risk factors include hypertension.  Negative major cardiovascular risk factors include female age less than 51 years old and non-tobacco-user status.     Habits & Providers  Alcohol-Tobacco-Diet     Tobacco Status: quit  Problems Prior to Update: 1)  Mitral Regurgitation, Mild  (ICD-396.3) 2)  Nephrolithiasis, Hx of  (ICD-V13.01) 3)  Chest Pain Unspecified  (ICD-786.50) 4)  Hypertension  (ICD-401.9) 5)  Murmur  (ICD-785.2) 6)  Depression, Mild  (ICD-311) 7)  Gerd  (ICD-530.81) 8)  Rotator Cuff Syndrome, Left  (ICD-726.10) 9)  Pain in Joint, Hand  (ICD-719.44) 10)  Examination, Routine Medical  (ICD-V70.0) 11)  Screening For Malignant Neoplasm, Cervix  (ICD-V76.2)  Current Medications (verified): 1)  Benazepril-Hydrochlorothiazide 20-25 Mg  Tabs (Benazepril-Hydrochlorothiazide) .Marland KitchenMarland KitchenMarland Kitchen  1 Tab By Mouth Every Morning 2)  Ibuprofen 600 Mg  Tabs (Ibuprofen) .Marland Kitchen.. 1 Tab By Mouth Three Times A Day With Meals As Needed. 3)  Pepcid 20 Mg Tabs (Famotidine) .... Take 1 Tablet By Mouth Two Times A Day For Indigestion 4)  Caltrate 600+d Plus 600-400 Mg-Unit Tabs (Calcium Carbonate-Vit D-Min) .... Take 1 Tablet By Mouth Two Times A Day 5)  Nitrostat 0.4 Mg Subl (Nitroglycerin) .Marland Kitchen.. 1 Sublingual As Needed For Chest Pain.  Repeat Q 5 Mins. As Need.  Call 911 Before 3 Rd Dose.  Sit Down To Take. 6)  Aspir-Low 81 Mg Tbec (Aspirin) .... Once Daily 7)  Zoloft 50 Mg Tabs (Sertraline Hcl) .... Take 1/2 At Bedtime 8)  Calan Sr 240 Mg Cr-Tabs (Verapamil Hcl) .... Take 1 Tablet By Mouth Once A Day 9)  Oxycodone-Acetaminophen 5-325 Mg Tabs (Oxycodone-Acetaminophen) .... One or Two Tabs As Needed For Pain 10)  Ciprofloxacin Hcl 500 Mg Tabs (Ciprofloxacin Hcl) 11)  Ondansetron Hcl 4 Mg Tabs (Ondansetron Hcl) 12)  Clonidine Hcl 0.2 Mg Tabs (Clonidine Hcl) .... Take 1 Tablet By Mouth Two Times A Day 13)  Cardura  2 Mg Tabs (Doxazosin Mesylate) .... Take 1 Tab By Mouth At Bedtime  Allergies (verified): No Known Drug Allergies  Past History:  Past Medical History: CHEST PAIN UNSPECIFIED (ICD-786.50)   a.  Negative stress Echo 05/29/2009 HYPERTENSION (ICD-401.9) MURMUR (ICD-785.2)   a.  mild MR/AI . Marland Kitchen Marland Kitchen needs repeat 05/2010 DEPRESSION, MILD (ICD-311) GERD (ICD-530.81) ROTATOR CUFF SYNDROME, LEFT (ICD-726.10) PAIN IN JOINT, HAND (ICD-719.44) EXAMINATION, ROUTINE MEDICAL (ICD-V70.0) SCREENING FOR MALIGNANT NEOPLASM, CERVIX (ICD-V76.2)    Physical Exam  General:  alert, well-developed, and well-nourished.   Head:  normocephalic and atraumatic.   Neck:  supple.   Lungs:  normal breath sounds, no crackles, and no wheezes.   Heart:  normal rate and regular rhythm.   1/6 systolic murmur heard best along the left sternal border Neurologic:  alert & oriented X3 and cranial nerves II-XII intact.   Psych:  normally interactive and good eye contact.     Impression & Recommendations:  Problem # 1:  EXAMINATION, ROUTINE MEDICAL (ICD-V70.0) patient refuses flu shot patient on wait list with Dr. Corinda Gubler patient says she did stool cards. . . no record in system  Problem # 2:  NEPHROLITHIASIS, HX OF (ICD-V13.01)  24 hour urine turned in today check u/a   Orders: T-Urinalysis ([id number]) T-Culture, Urine ([id number]) T- * Misc. Laboratory test [phone])  Problem # 3:  CHEST PAIN UNSPECIFIED (ICD-786.50) stress echo normal no further symptoms  Problem # 4:  HYPERTENSION (ICD-401.9) much better controlled continue current meds  Her updated medication list for this problem includes:    Benazepril-hydrochlorothiazide 20-25 Mg Tabs (Benazepril-hydrochlorothiazide) .Marland Kitchen... 1 tab by mouth every morning    Calan Sr 240 Mg Cr-tabs (Verapamil hcl) .Marland Kitchen... Take 1 tablet by mouth once a day    Clonidine Hcl 0.2 Mg Tabs (Clonidine hcl) .Marland Kitchen... Take 1 tablet by mouth two times a day    Cardura 2 Mg Tabs  (Doxazosin mesylate) .Marland Kitchen... Take 1 tab by mouth at bedtime  Problem # 5:  DEPRESSION, MILD (ICD-311) controlled and less side effects on current dose cont to see LCSW  Her updated medication list for this problem includes:    Zoloft 50 Mg Tabs (Sertraline hcl) .Marland Kitchen... Take 1/2 at bedtime  Problem # 6:  MITRAL REGURGITATION, MILD (ICD-396.3) repeat echo in 05/2010  Her updated medication list for this  problem includes:    Aspir-low 81 Mg Tbec (Aspirin) ..... Once daily  Complete Medication List: 1)  Benazepril-hydrochlorothiazide 20-25 Mg Tabs (Benazepril-hydrochlorothiazide) .Marland Kitchen.. 1 tab by mouth every morning 2)  Ibuprofen 600 Mg Tabs (Ibuprofen) .Marland Kitchen.. 1 tab by mouth three times a day with meals as needed. 3)  Pepcid 20 Mg Tabs (Famotidine) .... Take 1 tablet by mouth two times a day for indigestion 4)  Caltrate 600+d Plus 600-400 Mg-unit Tabs (Calcium carbonate-vit d-min) .... Take 1 tablet by mouth two times a day 5)  Nitrostat 0.4 Mg Subl (Nitroglycerin) .Marland Kitchen.. 1 sublingual as needed for chest pain.  repeat q 5 mins. as need.  call 911 before 3 rd dose.  sit down to take. 6)  Aspir-low 81 Mg Tbec (Aspirin) .... Once daily 7)  Zoloft 50 Mg Tabs (Sertraline hcl) .... Take 1/2 at bedtime 8)  Calan Sr 240 Mg Cr-tabs (Verapamil hcl) .... Take 1 tablet by mouth once a day 9)  Oxycodone-acetaminophen 5-325 Mg Tabs (Oxycodone-acetaminophen) .... One or two tabs as needed for pain 10)  Clonidine Hcl 0.2 Mg Tabs (Clonidine hcl) .... Take 1 tablet by mouth two times a day 11)  Cardura 2 Mg Tabs (Doxazosin mesylate) .... Take 1 tab by mouth at bedtime  Hypertension Assessment/Plan:      The patient's hypertensive risk group is category A: No risk factors and no target organ damage.  Her calculated 10 year risk of coronary heart disease is 8 %.  Today's blood pressure is 146/80.  Her blood pressure goal is < 140/90.  Patient Instructions: 1)  Please schedule a follow-up appointment in 3 months with  Scott for blood pressure.   Laboratory Results   Urine Tests  Date/Time Received: June 14, 2009 12:27 PM   Routine Urinalysis   Glucose: negative   (Normal Range: Negative) Bilirubin: negative   (Normal Range: Negative) Ketone: negative   (Normal Range: Negative) Spec. Gravity: 1.015   (Normal Range: 1.003-1.035) Blood: large   (Normal Range: Negative) pH: 5.0   (Normal Range: 5.0-8.0) Protein: negative   (Normal Range: Negative) Urobilinogen: 0.2   (Normal Range: 0-1) Nitrite: negative   (Normal Range: Negative) Leukocyte Esterace: negative   (Normal Range: Negative)

## 2010-06-11 NOTE — Letter (Signed)
Summary: *HSN Results Follow up  [facility]  [address] [place], [place] [address]   Phone: [phone]  Fax: [phone]      07/17/2009   Susan Russell [address] [place], [place]  [address]   Dear  Ms. [name],                            ____S.Drinkard,FNP   ____D. Gore,FNP       ____B. McPherson,MD   ____V. Rankins,MD    ____E. Mulberry,MD    ____N. Daphine Deutscher, FNP  ____D. Reche Dixon, MD    ____K. Philipp Deputy, MD    ____Other     This letter is to inform you that your recent test(s):  _______Pap Smear    ___X____Lab Test     _______X-ray    ___X____ is within acceptable limits  _______ requires a medication change  _______ requires a follow-up lab visit  _______ requires a follow-up visit with your provider   Comments: Your urine test is normal.      _________________________________________________________ If you have any questions, please contact our office                     Sincerely,  [name] [facility]

## 2010-06-11 NOTE — Assessment & Plan Note (Signed)
Summary: FOLLOW UP WITH Susan Russell//GK   Vital Signs:  Patient profile:   55 year old female Height:      59.25 inches Weight:      151 pounds BMI:     30.35 Temp:     97.7 degrees F oral Pulse rate:   69 / minute Pulse rhythm:   regular Resp:     18 per minute BP sitting:   165 / 98  (left arm) Cuff size:   large  Vitals Entered By: [name] (May 24, 2009 10:11 AM) CC: f/u...Marland KitchenMarland Kitchen pt was in hosipital.... Is Patient Diabetic? No Pain Assessment Patient in pain? no       Does patient need assistance? Functional Status Self care Ambulation Normal   Primary Care Provider:  Tereso Newcomer PA-C  CC:  f/u...Marland KitchenMarland Kitchen pt was in hosipital.....  History of Present Illness: Here for f/u.  Has seen card.  Needs stress test.  Pt. says scheduled for 05/29/2009.  Has not had any more chest pain.  Still taking asa once daily.    Recently went to ED with flank pain.  CT demonstrated mod. left sided hydroureteronephrosis without renal or obstructing ureteral or bladder calculi.  She has + bact in her urine with high WBC and was tx. for prob. pyelonephritis in the setting of recently passed kidney stone.  She was given name of urologist to see if she felt worse.  She is feeling better.  No more flank pain.  ? fever, + chills.  No more n/v/d.  Eating and drinking ok now.  Urinating ok.  NO hematuria.  Still taking antibiotic. . . not yet finished.  Recenly decreased zoloft due to drowsiness.  Patient still feels like mood is better.  No suicidal ideations.  She still plans to continue to see LCSW.     Current Medications (verified): 1)  Benazepril-Hydrochlorothiazide 20-25 Mg  Tabs (Benazepril-Hydrochlorothiazide) .Marland Kitchen.. 1 Tab By Mouth Every Morning 2)  Ibuprofen 600 Mg  Tabs (Ibuprofen) .Marland Kitchen.. 1 Tab By Mouth Three Times A Day With Meals As Needed. 3)  Pepcid 20 Mg Tabs (Famotidine) .... Take 1 Tablet By Mouth Two Times A Day For Indigestion 4)  Caltrate 600+d Plus 600-400 Mg-Unit Tabs (Calcium  Carbonate-Vit D-Min) .... Take 1 Tablet By Mouth Two Times A Day 5)  Nitrostat 0.4 Mg Subl (Nitroglycerin) .Marland Kitchen.. 1 Sublingual As Needed For Chest Pain.  Repeat Q 5 Mins. As Need.  Call 911 Before 3 Rd Dose.  Sit Down To Take. 6)  Aspir-Low 81 Mg Tbec (Aspirin) .... Once Daily 7)  Zoloft 50 Mg Tabs (Sertraline Hcl) .... Take 1/2 At Bedtime 8)  Calan Sr 240 Mg Cr-Tabs (Verapamil Hcl) .... Take 1 Tablet By Mouth Once A Day 9)  Amlodipine Besylate 10 Mg Tabs (Amlodipine Besylate) .... Take One Tablet By Mouth Daily 10)  Oxycodone-Acetaminophen 5-325 Mg Tabs (Oxycodone-Acetaminophen) .... One or Two Tabs As Needed For Pain 11)  Ciprofloxacin Hcl 500 Mg Tabs (Ciprofloxacin Hcl) 12)  Ondansetron Hcl 4 Mg Tabs (Ondansetron Hcl)  Allergies (verified): No Known Drug Allergies  Physical Exam  General:  alert, well-developed, and well-nourished.   Head:  normocephalic and atraumatic.   Lungs:  normal breath sounds, no crackles, and no wheezes.   Heart:  normal rate and regular rhythm.   Abdomen:  soft and non-tender.   Msk:  no CVA tenderness bilat  Extremities:  no edema Neurologic:  alert & oriented X3 and cranial nerves II-XII intact.   Psych:  normally interactive.     Impression & Recommendations:  Problem # 1:  HYPERTENSION (ICD-401.9) increase clonidine to 0.2 two times a day   Her updated medication list for this problem includes:    Benazepril-hydrochlorothiazide 20-25 Mg Tabs (Benazepril-hydrochlorothiazide) .Marland Kitchen... 1 tab by mouth every morning    Calan Sr 240 Mg Cr-tabs (Verapamil hcl) .Marland Kitchen... Take 1 tablet by mouth once a day    Amlodipine Besylate 10 Mg Tabs (Amlodipine besylate) .Marland Kitchen... Take one tablet by mouth daily    Clonidine Hcl 0.2 Mg Tabs (Clonidine hcl) .Marland Kitchen... Take 1 tablet by mouth two times a day    Cardura 2 Mg Tabs (Doxazosin mesylate) .Marland Kitchen... Take 1 tab by mouth at bedtime  Problem # 2:  NEPHROLITHIASIS, HX OF (ICD-V13.01)  complete antibxs check labs    Orders: T-Comprehensive Metabolic Panel ([id number]) T-Uric Acid (Blood) ([id number]) T- * Misc. Laboratory test ([phone])  Problem # 3:  CHEST PAIN UNSPECIFIED (ICD-786.50) w/u pending with card  Problem # 4:  DEPRESSION, MILD (ICD-311) stable on current meds  Her updated medication list for this problem includes:    Zoloft 50 Mg Tabs (Sertraline hcl) .Marland Kitchen... Take 1/2 at bedtime  Complete Medication List: 1)  Benazepril-hydrochlorothiazide 20-25 Mg Tabs (Benazepril-hydrochlorothiazide) .Marland Kitchen.. 1 tab by mouth every morning 2)  Ibuprofen 600 Mg Tabs (Ibuprofen) .Marland Kitchen.. 1 tab by mouth three times a day with meals as needed. 3)  Pepcid 20 Mg Tabs (Famotidine) .... Take 1 tablet by mouth two times a day for indigestion 4)  Caltrate 600+d Plus 600-400 Mg-unit Tabs (Calcium carbonate-vit d-min) .... Take 1 tablet by mouth two times a day 5)  Nitrostat 0.4 Mg Subl (Nitroglycerin) .Marland Kitchen.. 1 sublingual as needed for chest pain.  repeat q 5 mins. as need.  call 911 before 3 rd dose.  sit down to take. 6)  Aspir-low 81 Mg Tbec (Aspirin) .... Once daily 7)  Zoloft 50 Mg Tabs (Sertraline hcl) .... Take 1/2 at bedtime 8)  Calan Sr 240 Mg Cr-tabs (Verapamil hcl) .... Take 1 tablet by mouth once a day 9)  Amlodipine Besylate 10 Mg Tabs (Amlodipine besylate) .... Take one tablet by mouth daily 10)  Oxycodone-acetaminophen 5-325 Mg Tabs (Oxycodone-acetaminophen) .... One or two tabs as needed for pain 11)  Ciprofloxacin Hcl 500 Mg Tabs (Ciprofloxacin hcl) 12)  Ondansetron Hcl 4 Mg Tabs (Ondansetron hcl) 13)  Clonidine Hcl 0.2 Mg Tabs (Clonidine hcl) .... Take 1 tablet by mouth two times a day 14)  Cardura 2 Mg Tabs (Doxazosin mesylate) .... Take 1 tab by mouth at bedtime  Patient Instructions: 1)  Please schedule a follow-up appointment in 3 weeks for kidney stones.  2)  Finish out antibiotics. 3)  Call if you start to feel worse. Prescriptions: CLONIDINE HCL 0.2 MG TABS (CLONIDINE HCL) Take 1 tablet by  mouth two times a day  #60 x 5   Entered and Authorized by:   Tereso Newcomer PA-C   Signed by:   Tereso Newcomer PA-C on 05/24/2009   Method used:   Faxed to ...       [facility] (retail)       [address] [place], [place]  [address]       Ph: [phone]       Fax: [phone]   RxID:   [id number]

## 2010-06-11 NOTE — Letter (Signed)
Summary: REFERRAL//CARDIOLOGY//APPT DATE & TIME  REFERRAL//CARDIOLOGY//APPT DATE & TIME   Imported By: Arta Bruce 05/18/2009 15:48:59  _____________________________________________________________________  External Attachment:    Type:   Image     Comment:   External Document

## 2010-06-11 NOTE — Letter (Signed)
Summary: Referral - not able to see patient  [facility]  [address] [place], [place] [address]   Phone: [phone]  Fax: [phone]    December 28, 2009    Tereso Newcomer, PA-C [address] [facility] [place] [place], [place] [address]   Re:   Susan Russell DOB:  [birth year] MRN:   [id number]    Dear Dr. Alben Spittle:  Thank you for your kind referral of the above patient.  We have attempted to schedule the recommended procedure Screening Colonoscopy but have not been able to schedule because:   X  The patient was not available by phone and/or has not returned our calls.  ___ The patient declined to schedule the procedure at this time.  We appreciate the referral and hope that we will have the opportunity to treat this patient in the future.    Sincerely,    [facility] [phone]

## 2010-06-11 NOTE — Assessment & Plan Note (Signed)
Summary: FOLLOW UP IN 3 MONTH WITH SCOTT FOR BP//GK   Vital Signs:  Patient profile:   55 year old female Height:      59.25 inches Weight:      149 pounds BMI:     29.95 Temp:     97.4 degrees F oral Pulse rate:   69 / minute Pulse rhythm:   regular Resp:     18 per minute BP sitting:   104 / 70  (left arm) Cuff size:   large  Vitals Entered By: [name] (Sep 17, 2009 8:44 AM) CC: three month f/u... pt says she is tired and weak and she stopped taking the med and she feels better: clonidine and cardura, Hypertension Management Is Patient Diabetic? No Pain Assessment Patient in pain? no       Does patient need assistance? Functional Status Self care Ambulation Normal   Primary Care Provider:  Tereso Newcomer PA-C  CC:  three month f/u... pt says she is tired and weak and she stopped taking the med and she feels better: clonidine and cardura and Hypertension Management.  History of Present Illness: Here for f/u on HTN and depression.  Depression:  PHQ9=1 today.  Thinks counseling is helping a great deal.   Bethann Goo for last appt today.  She is not taking Zoloft at all anymore.  She stopped taking due to excessive sleepiness.  HTN:  Yukie Bergeron states she stopped taking Clonidine and Cardura due to weakness.  She stopped the meds about a week ago.  Feels better now.    Health Maint:  Dr. Corinda Gubler likely not coming back.  Will refer out to local GI for screening colo.  Hypertension History:      She denies headache, chest pain, dyspnea with exertion, and syncope.  Further comments include: stopped clonidine and cardura due to "weakness".        Positive major cardiovascular risk factors include hypertension.  Negative major cardiovascular risk factors include female age less than 73 years old and non-tobacco-user status.     Problems Prior to Update: 1)  Allergic Rhinitis Cause Unspecified  (ICD-477.9) 2)  Mitral Regurgitation, Mild  (ICD-396.3) 3)   Nephrolithiasis, Hx of  (ICD-V13.01) 4)  Chest Pain Unspecified  (ICD-786.50) 5)  Hypertension  (ICD-401.9) 6)  Murmur  (ICD-785.2) 7)  Depression, Mild  (ICD-311) 8)  Gerd  (ICD-530.81) 9)  Rotator Cuff Syndrome, Left  (ICD-726.10) 10)  Pain in Joint, Hand  (ICD-719.44) 11)  Examination, Routine Medical  (ICD-V70.0) 12)  Screening For Malignant Neoplasm, Cervix  (ICD-V76.2)  Current Medications (verified): 1)  Benazepril-Hydrochlorothiazide 20-25 Mg  Tabs (Benazepril-Hydrochlorothiazide) .Marland Kitchen.. 1 Tab By Mouth Every Morning 2)  Ibuprofen 600 Mg  Tabs (Ibuprofen) .Marland Kitchen.. 1 Tab By Mouth Three Times A Day With Meals As Needed. 3)  Pepcid 20 Mg Tabs (Famotidine) .... Take 1 Tablet By Mouth Two Times A Day For Indigestion 4)  Caltrate 600+d Plus 600-400 Mg-Unit Tabs (Calcium Carbonate-Vit D-Min) .... Take 1 Tablet By Mouth Two Times A Day 5)  Nitrostat 0.4 Mg Subl (Nitroglycerin) .Marland Kitchen.. 1 Sublingual As Needed For Chest Pain.  Repeat Q 5 Mins. As Need.  Call 911 Before 3 Rd Dose.  Sit Down To Take. 6)  Aspir-Low 81 Mg Tbec (Aspirin) .... Once Daily 7)  Zoloft 50 Mg Tabs (Sertraline Hcl) .... Take 1/2 At Bedtime 8)  Calan Sr 240 Mg Cr-Tabs (Verapamil Hcl) .... Take 1 Tablet By Mouth Once A Day 9)  Oxycodone-Acetaminophen  5-325 Mg Tabs (Oxycodone-Acetaminophen) .... One or Two Tabs As Needed For Pain 10)  Clonidine Hcl 0.2 Mg Tabs (Clonidine Hcl) .... Take 1 Tablet By Mouth Two Times A Day 11)  Cardura 2 Mg Tabs (Doxazosin Mesylate) .... Take 1 Tab By Mouth At Bedtime  Allergies (verified): No Known Drug Allergies  Past History:  Past Medical History: Last updated: 06/14/2009 CHEST PAIN UNSPECIFIED (ICD-786.50)   a.  Negative stress Echo 05/29/2009 HYPERTENSION (ICD-401.9) MURMUR (ICD-785.2)   a.  mild MR/AI . Marland Kitchen Marland Kitchen needs repeat 05/2010 DEPRESSION, MILD (ICD-311) GERD (ICD-530.81) ROTATOR CUFF SYNDROME, LEFT (ICD-726.10) PAIN IN JOINT, HAND (ICD-719.44) EXAMINATION, ROUTINE MEDICAL  (ICD-V70.0) SCREENING FOR MALIGNANT NEOPLASM, CERVIX (ICD-V76.2)    Past Surgical History: Last updated: 07/05/2007 h/o "cyst surgery"....gyn surgery..."benign"  age 26's  Physical Exam  General:  alert, well-developed, and well-nourished.   Head:  normocephalic and atraumatic.   Eyes:  pupils equal, pupils round, pupils reactive to light, no optic disk abnormalities, and pterygium.   Neck:  supple.   Lungs:  normal breath sounds.   Heart:  normal rate and regular rhythm.   2/6 sys murmur RUSB  Extremities:  no edema Neurologic:  alert & oriented X3 and cranial nerves II-XII intact.   Psych:  normally interactive and good eye contact.     Impression & Recommendations:  Problem # 1:  EXAMINATION, ROUTINE MEDICAL (ICD-V70.0) refer to GI for screening colo  Problem # 2:  HYPERTENSION (ICD-401.9)  BP well controlled on just benaz/hctz and calan not sure how she was taking meds before cont. current mgmt for now repeat BP check with RN in 1 month  The following medications were removed from the medication list:    Clonidine Hcl 0.2 Mg Tabs (Clonidine hcl) .Marland Kitchen... Take 1 tablet by mouth two times a day    Cardura 2 Mg Tabs (Doxazosin mesylate) .Marland Kitchen... Take 1 tab by mouth at bedtime Her updated medication list for this problem includes:    Benazepril-hydrochlorothiazide 20-25 Mg Tabs (Benazepril-hydrochlorothiazide) .Marland Kitchen... 1 tab by mouth every morning    Calan Sr 240 Mg Cr-tabs (Verapamil hcl) .Marland Kitchen... Take 1 tablet by mouth once a day  Orders: T-Basic Metabolic Panel [phone]) T-Urinalysis (14782-95621)  Problem # 3:  DEPRESSION, MILD (ICD-311) stable cont with counseling remain off SSRI  The following medications were removed from the medication list:    Zoloft 50 Mg Tabs (Sertraline hcl) .Marland Kitchen... Take 1/2 at bedtime  Problem # 4:  ALLERGIC RHINITIS CAUSE UNSPECIFIED (ICD-477.9) notes itchy eyes has pterygium bilat as well try visine gtts  Problem # 5:  NEPHROLITHIASIS,  HX OF (ICD-V13.01) had some hematuria recently due for repeat u/a today  Orders: T-Urinalysis ([id number])  Complete Medication List: 1)  Benazepril-hydrochlorothiazide 20-25 Mg Tabs (Benazepril-hydrochlorothiazide) .Marland Kitchen.. 1 tab by mouth every morning 2)  Ibuprofen 600 Mg Tabs (Ibuprofen) .Marland Kitchen.. 1 tab by mouth three times a day with meals as needed. 3)  Pepcid 20 Mg Tabs (Famotidine) .... Take 1 tablet by mouth two times a day for indigestion 4)  Caltrate 600+d Plus 600-400 Mg-unit Tabs (Calcium carbonate-vit d-min) .... Take 1 tablet by mouth two times a day 5)  Nitrostat 0.4 Mg Subl (Nitroglycerin) .Marland Kitchen.. 1 sublingual as needed for chest pain.  repeat q 5 mins. as need.  call 911 before 3 rd dose.  sit down to take. 6)  Aspir-low 81 Mg Tbec (Aspirin) .... Once daily 7)  Calan Sr 240 Mg Cr-tabs (Verapamil hcl) .... Take 1 tablet by mouth once  a day 8)  Oxycodone-acetaminophen 5-325 Mg Tabs (Oxycodone-acetaminophen) .... One or two tabs as needed for pain 9)  Visine 0.05 % Soln (Tetrahydrozoline hcl) .Marland Kitchen.. 1-2 drops in each eye every 6-8 hours as needed  Hypertension Assessment/Plan:      The patient's hypertensive risk group is category A: No risk factors and no target organ damage.  Her calculated 10 year risk of coronary heart disease is 3 %.  Today's blood pressure is 104/70.  Her blood pressure goal is < 140/90.  Patient Instructions: 1)  Please schedule a follow-up appointment in 1 month with nurse for blood pressure check. 2)  Shedule CPP with Lorin Picket in October 2011. 3)    Prescriptions: VISINE 0.05 % SOLN (TETRAHYDROZOLINE HCL) 1-2 drops in each eye every 6-8 hours as needed  #1 bottle x 5   Entered and Authorized by:   Tereso Newcomer PA-C   Signed by:   Tereso Newcomer PA-C on 09/17/2009   Method used:   Faxed to ...       [facility] (retail)       [address] [place], [place]  [address]       Ph: [phone]       Fax: [phone]    RxID:   [id number]   Laboratory Results   Urine Tests  Date/Time Received: Sep 17, 2009 9:48 AM   Routine Urinalysis   Glucose: negative   (Normal Range: Negative) Bilirubin: negative   (Normal Range: Negative) Ketone: negative   (Normal Range: Negative) Spec. Gravity: >=1.030   (Normal Range: 1.003-1.035) Blood: trace-lysed   (Normal Range: Negative) pH: 5.0   (Normal Range: 5.0-8.0) Protein: negative   (Normal Range: Negative) Urobilinogen: 0.2   (Normal Range: 0-1) Nitrite: negative   (Normal Range: Negative) Leukocyte Esterace: negative   (Normal Range: Negative)

## 2010-06-11 NOTE — Letter (Signed)
Summary: *HSN Results Follow up  [facility]  [address] [place], [place] [address]   Phone: [phone]  Fax: [phone]      10/15/2009   AMBUR PROVINCE [address] [place], [place]  [address]   Dear  Ms. Yaritzy Puckett,                            ____S.Drinkard,FNP   ____D. Gore,FNP       ____B. McPherson,MD   ____V. Rankins,MD    ____E. Mulberry,MD    ____N. Daphine Deutscher, FNP  ____D. Reche Dixon, MD    ____K. Philipp Deputy, MD    __x_S. Alben Spittle, PA-C     This letter is to inform you that your recent test(s):  _______Pap Smear    ___x____Lab Test     _______X-ray    ____x___ is within acceptable limits  _______ requires a medication change  _______ requires a follow-up lab visit  _______ requires a follow-up visit with your provider   Comments:       _________________________________________________________ If you have any questions, please contact our office                     Sincerely,  Tereso Newcomer PA-C [facility]

## 2010-06-11 NOTE — Progress Notes (Signed)
Summary: Office Visit/DEPRESSION SCREENING  Office Visit/DEPRESSION SCREENING   Imported By: Arta Bruce 07/16/2009 12:38:57  _____________________________________________________________________  External Attachment:    Type:   Image     Comment:   External Document

## 2010-06-13 NOTE — Assessment & Plan Note (Signed)
Summary: Complete Physical Exam   Vital Signs:  Patient profile:   55 year old female Menstrual status:  irregular LMP:     04/08/2010 Weight:      153.44 pounds Temp:     97 degrees F oral Pulse rate:   84 / minute Pulse rhythm:   regular Resp:     20 per minute BP sitting:   144 / 98  (left arm) Cuff size:   regular  Vitals Entered By: Hale Drone CMA (April 22, 2010 1:50 PM) CC: 55 y/o CPP. Concners about her menstrual periods. Last reg. period was in Sept. In Oct. she did not have one. She was only spotting. On Nov. 28th  she had a heavy period and is still spotting. , Hypertension Management Is Patient Diabetic? No Pain Assessment Patient in pain? no       Does patient need assistance? Functional Status Self care Ambulation Normal  Vision Screening:      Vision Comments: 02/2011 LMP (date): 04/08/2010 LMP - Character: heavy     Menstrual flow (days): 5 Menstrual Status irregular Enter LMP: 04/08/2010 Last PAP Result  Specimen Adequacy: Satisfactory for evaluation.   Interpretation/Result:Negative for intraepithelial Lesion or Malignancy.      Primary Care Provider:  Tereso Newcomer PA-C  CC:  55 y/o CPP. Concners about her menstrual periods. Last reg. period was in Sept. In Oct. she did not have one. She was only spotting. On Nov. 28th  she had a heavy period and is still spotting.  and Hypertension Management.  History of Present Illness:  Pt into the office for a complete physical exam  PAP - started 03/08/2010 and lasted for 6 days with heavy flow during which she had to use 2 pads daily. October only spotting. September menses was normal Last PAP was 1 year ago in this office.  normal.  Mammogram - last done 02/08/2010 self breast exams at home  Optho - wears prescription glasses.  Last eye exam was 2 months ago.  Dental - no recent dental exam.  She does have some problems with her teeth that she neds attention.  No pain in teeth at this  time.  Pt presents today with her medications  Community Liason present today with pt during exam - spanish  Colonscopy done on 02/2010  Hypertension History:      She denies headache, chest pain, and palpitations.  She notes no problems with any antihypertensive medication side effects.        Positive major cardiovascular risk factors include hypertension.  Negative major cardiovascular risk factors include female age less than 15 years old and non-tobacco-user status.    Habits & Providers  Alcohol-Tobacco-Diet     Alcohol drinks/day: 0     Tobacco Status: quit  Exercise-Depression-Behavior     Have you felt down or hopeless? no     Have you felt little pleasure in things? no     Depression Counseling: not indicated; screening negative for depression     Drug Use: no  Comments: PHQ-9 score = 2  Current Medications (verified): 1)  Benazepril-Hydrochlorothiazide 20-25 Mg  Tabs (Benazepril-Hydrochlorothiazide) .Marland Kitchen.. 1 Tab By Mouth Every Morning 2)  Ibuprofen 600 Mg  Tabs (Ibuprofen) .Marland Kitchen.. 1 Tab By Mouth Three Times A Day With Meals As Needed. 3)  Pepcid 20 Mg Tabs (Famotidine) .... Take 1 Tablet By Mouth Two Times A Day For Indigestion 4)  Caltrate 600+d Plus 600-400 Mg-Unit Tabs (Calcium Carbonate-Vit D-Min) .... Take 1 Tablet  By Mouth Two Times A Day 5)  Nitrostat 0.4 Mg Subl (Nitroglycerin) .Marland Kitchen.. 1 Sublingual As Needed For Chest Pain.  Repeat Q 5 Mins. As Need.  Call 911 Before 3 Rd Dose.  Sit Down To Take. 6)  Aspir-Low 81 Mg Tbec (Aspirin) .... Once Daily 7)  Calan Sr 240 Mg Cr-Tabs (Verapamil Hcl) .... Take 1 Tablet By Mouth Once A Day 8)  Visine 0.05 % Soln (Tetrahydrozoline Hcl) .Marland Kitchen.. 1-2 Drops in Each Eye Every 6-8 Hours As Needed 9)  Miralax  Powd (Polyethylene Glycol 3350) .... As Directed For Colonoscopy Prep 10)  Cardura 2 Mg Tabs (Doxazosin Mesylate) .... Take 1 Tablet By Mouth Once A Day 11)  Doxycycline Hyclate 100 Mg Caps (Doxycycline Hyclate) .... Take 1 Cap Daily  By Mouth  Allergies (verified): No Known Drug Allergies  Review of Systems General:  Denies fever; +snoring. Eyes:  Denies blurring. ENT:  Denies earache. CV:  Denies chest pain or discomfort. Resp:  Denies cough. GI:  Denies abdominal pain, nausea, and vomiting. GU:  Denies discharge. MS:  Denies joint pain. Derm:  Denies hair loss. Neuro:  Denies headaches. Psych:  Denies anxiety and depression.  Physical Exam  General:  alert.   Head:  normocephalic.   Eyes:  glasses pinguecula.   Ears:  bil TM with bony landmarks present Nose:  no nasal discharge.   Mouth:  pharynx pink and moist.  some broken teeth Neck:  supple.   Chest Wall:  no mass.   Breasts:  skin/areolae normal and no abnormal thickening.   Lungs:  normal breath sounds.   Heart:  normal rate and regular rhythm.   Abdomen:  soft, non-tender, and normal bowel sounds.   Msk:  up to the exam table Pulses:  R radial normal and L radial normal.   Extremities:  noedema Neurologic:  alert & oriented X3.   Skin:  color normal.   Psych:  Oriented X3.    Diabetes Management Exam:    Eye Exam:       Eye Exam done elsewhere          Date: 02/11/2010          Results: normal          Done by: Optho   Impression & Recommendations:  Problem # 1:  SCREENING FOR MALIGNANT NEOPLASM, CERVIX (ICD-V76.2) labs done  PAP done Mammogram done 02/08/2010 colonscopy done 02/2010  Problem # 2:  HYPERTENSION (ICD-401.9) Bp is slightly elevated today. Normal on previous visits continue current meds check BP at local pharmacies Her updated medication list for this problem includes:    Benazepril-hydrochlorothiazide 20-25 Mg Tabs (Benazepril-hydrochlorothiazide) .Marland Kitchen... 1 tab by mouth every morning    Calan Sr 240 Mg Cr-tabs (Verapamil hcl) .Marland Kitchen... Take 1 tablet by mouth once a day    Cardura 2 Mg Tabs (Doxazosin mesylate) .Marland Kitchen... Take 1 tablet by mouth once a day  Orders: T-Lipid Profile (78295-62130) T-Comprehensive Metabolic  Panel (86578-46962) T-CBC w/Diff (95284-13244) Rapid HIV  (92370) T-Syphilis Test (RPR) (01027-25366) T-TSH (44034-74259) T-Urine Microalbumin w/creat. ratio [phone])  Problem # 3:  NEED PROPHYLACTIC VACCINATION&INOCULATION FLU (ICD-V04.81) given today in office  Complete Medication List: 1)  Benazepril-hydrochlorothiazide 20-25 Mg Tabs (Benazepril-hydrochlorothiazide) .Marland Kitchen.. 1 tab by mouth every morning 2)  Ibuprofen 600 Mg Tabs (Ibuprofen) .Marland Kitchen.. 1 tab by mouth three times a day with meals as needed. 3)  Pepcid 20 Mg Tabs (Famotidine) .... Take 1 tablet by mouth two times a day for indigestion 4)  Caltrate 600+d Plus 600-400 Mg-unit Tabs (Calcium carbonate-vit d-min) .... Take 1 tablet by mouth two times a day 5)  Nitrostat 0.4 Mg Subl (Nitroglycerin) .Marland Kitchen.. 1 sublingual as needed for chest pain.  repeat q 5 mins. as need.  call 911 before 3 rd dose.  sit down to take. 6)  Aspir-low 81 Mg Tbec (Aspirin) .... Once daily 7)  Calan Sr 240 Mg Cr-tabs (Verapamil hcl) .... Take 1 tablet by mouth once a day 8)  Visine 0.05 % Soln (Tetrahydrozoline hcl) .Marland Kitchen.. 1-2 drops in each eye every 6-8 hours as needed 9)  Miralax Powd (Polyethylene glycol 3350) .... As directed for colonoscopy prep 10)  Cardura 2 Mg Tabs (Doxazosin mesylate) .... Take 1 tablet by mouth once a day  Other Orders: Flu Vaccine 85yrs + [id number]) Admin 1st Vaccine (60454) T- GC Chlamydia [id number]) KOH/ WET [place] [phone]) Pap Smear, Thin Prep ( Collection of) (G9562) UA Dipstick w/o Micro (manual) (81002)  Diabetes Management Assessment/Plan:      Her blood pressure goal is < 140/90.    Hypertension Assessment/Plan:      The patient's hypertensive risk group is category A: No risk factors and no target organ damage.  Her calculated 10 year risk of coronary heart disease is 8 %.  Today's blood pressure is 144/98.  Her blood pressure goal is < 140/90.   Patient Instructions: 1)  You have been given the flu vaccine today 2)   You will be notified of your lab results. 3)  You are likely going there perimenopause.  Read the handout.  You may have some changes in your periods. 4)  Blood pressure - a little high today.  Take medications as ordered 5)  Start to exercise to lower your weight 6)  Avoid sodium in your diet 7)  Check your blood pressure when you go into local pharmacies   Orders Added: 1)  Flu Vaccine 73yrs + [90658] 2)  Admin 1st Vaccine [90471] 3)  Est. Patient age 58-64 [18] 4)  T-Lipid Profile [phone] 5)  T-Comprehensive Metabolic Panel [80053-22900] 6)  T-CBC w/Diff [96295-28413] 7)  Rapid HIV  [92370] 8)  T-Syphilis Test (RPR) [86592-23940] 9)  T-TSH [24401-02725] 10)  T- GC Chlamydia [36644] 11)  KOH/ WET Mount [87210] 12)  Pap Smear, Thin Prep ( Collection of) [Q0091] 13)  UA Dipstick w/o Micro (manual) [81002] 14)  T-Urine Microalbumin w/creat. ratio [82043-[address]]   Immunizations Administered:  Influenza Vaccine # 1:    Vaccine Type: Fluvax 3+    Site: left deltoid    Mfr: GlaxoSmithKline    Dose: 0.5 ml    Route: IM    Given by: Hale Drone CMA    Exp. Date: 11/09/2010    Lot #: [id number]    VIS given: 12/04/09 version given April 22, 2010.  Flu Vaccine Consent Questions:    Do you have a history of severe allergic reactions to this vaccine? no    Any prior history of allergic reactions to egg and/or gelatin? no    Do you have a sensitivity to the preservative Thimersol? no    Do you have a past history of Guillan-Barre Syndrome? no    Do you currently have an acute febrile illness? no    Have you ever had a severe reaction to latex? no    Vaccine information given and explained to patient? yes    Are you currently pregnant? no   Immunizations Administered:  Influenza Vaccine # 1:  Vaccine Type: Fluvax 3+    Site: left deltoid    Mfr: GlaxoSmithKline    Dose: 0.5 ml    Route: IM    Given by: Hale Drone CMA    Exp. Date: 11/09/2010    Lot #:  [id number]    VIS given: 12/04/09 version given April 22, 2010.  Laboratory Results   Urine Tests  Date/Time Received: April 22, 2010 2:19 PM   Routine Urinalysis   Color: lt. yellow Appearance: Clear Glucose: negative   (Normal Range: Negative) Bilirubin: negative   (Normal Range: Negative) Ketone: negative   (Normal Range: Negative) Spec. Gravity: 1.020   (Normal Range: 1.003-1.035) Blood: moderate   (Normal Range: Negative) pH: 6.0   (Normal Range: 5.0-8.0) Protein: negative   (Normal Range: Negative) Urobilinogen: 0.2   (Normal Range: 0-1) Nitrite: negative   (Normal Range: Negative) Leukocyte Esterace: negative   (Normal Range: Negative)    Date/Time Received: April 22, 2010 5:44 PM   [employer] [place] Source: vaginal WBC/hpf: 1-5 Bacteria/hpf: rare Clue cells/hpf: none Yeast/hpf: none Wet Mount KOH: Negative Trichomonas/hpf: none Comments: guiac deferred - pt just has colonscopy     EKG  Procedure date:  04/22/2010  Findings:      NSR     Appended Document: Complete Physical Exam     Vital Signs:  Patient profile:   55 year old female Menstrual status:  irregular  Allergies: No Known Drug Allergies   Complete Medication List: 1)  Benazepril-hydrochlorothiazide 20-25 Mg Tabs (Benazepril-hydrochlorothiazide) .Marland Kitchen.. 1 tab by mouth every morning 2)  Ibuprofen 600 Mg Tabs (Ibuprofen) .Marland Kitchen.. 1 tab by mouth three times a day with meals as needed. 3)  Pepcid 20 Mg Tabs (Famotidine) .... Take 1 tablet by mouth two times a day for indigestion 4)  Caltrate 600+d Plus 600-400 Mg-unit Tabs (Calcium carbonate-vit d-min) .... Take 1 tablet by mouth two times a day 5)  Nitrostat 0.4 Mg Subl (Nitroglycerin) .Marland Kitchen.. 1 sublingual as needed for chest pain.  repeat q 5 mins. as need.  call 911 before 3 rd dose.  sit down to take. 6)  Aspir-low 81 Mg Tbec (Aspirin) .... Once daily 7)  Calan Sr 240 Mg Cr-tabs (Verapamil hcl) .... Take 1 tablet by mouth once a day 8)   Visine 0.05 % Soln (Tetrahydrozoline hcl) .Marland Kitchen.. 1-2 drops in each eye every 6-8 hours as needed 9)  Miralax Powd (Polyethylene glycol 3350) .... As directed for colonoscopy prep 10)  Cardura 2 Mg Tabs (Doxazosin mesylate) .... Take 1 tablet by mouth once a day  Other Orders: Flu Vaccine 5yrs + (75643) Admin 1st Vaccine (32951)   Orders Added: 1)  Flu Vaccine 95yrs + [88416] 2)  Admin 1st Vaccine [60630]   Immunizations Administered:  Influenza Vaccine # 1:    Vaccine Type: Fluvax 3+    Site: right deltoid    Mfr: GlaxoSmithKline    Dose: 0.5 ml    Route: IM    Given by: Levon Hedger    Exp. Date: 11/09/2010    Lot #: [id number]    VIS given: 12/04/09 version given April 23, 2010.  Flu Vaccine Consent Questions:    Do you have a history of severe allergic reactions to this vaccine? no    Any prior history of allergic reactions to egg and/or gelatin? no    Do you have a sensitivity to the preservative Thimersol? no    Do you have a past history of Guillan-Barre Syndrome? no    Do  you currently have an acute febrile illness? no    Have you ever had a severe reaction to latex? no    Vaccine information given and explained to patient? yes    Are you currently pregnant? no    ndc  [phone]  Immunizations Administered:  Influenza Vaccine # 1:    Vaccine Type: Fluvax 3+    Site: right deltoid    Mfr: GlaxoSmithKline    Dose: 0.5 ml    Route: IM    Given by: Levon Hedger    Exp. Date: 11/09/2010    Lot #: [id number]    VIS given: 12/04/09 version given April 23, 2010.

## 2010-06-13 NOTE — Letter (Signed)
Summary: *HSN Results Follow up  [facility]  [address] [place], [place] [address]   Phone: [phone]  Fax: [phone]      04/25/2010   Susan Russell [address]  [place], [place]  [address]   Dear  Ms. [name],                            ____S.Drinkard,FNP   ____D. Gore,FNP       ____B. McPherson,MD   ____V. Rankins,MD    ____E. Mulberry,MD    _X___N. Daphine Deutscher, FNP  ____D. Reche Dixon, MD    ____K. Philipp Deputy, MD    ____Other     This letter is to inform you that your recent test(s):  __X_____Pap Smear    _______Lab Test     _______X-ray    ___X____ is within acceptable limits  _______ requires a medication change  _______ requires a follow-up lab visit  _______ requires a follow-up visit with your [name]   Comments: Pap Smear results are normal.       _________________________________________________________ If you have any questions, please contact our office [phone].                    Sincerely,    [name] FNP [facility]

## 2010-06-13 NOTE — Letter (Signed)
Summary: Handout Printed  Printed Handout:  - Perimenopause 

## 2010-06-13 NOTE — Progress Notes (Signed)
Summary: Office Visit//DEPRESSION,SCREENIG  Office Visit//DEPRESSION,SCREENIG   Imported By: Arta Bruce 04/23/2010 13:44:30  _____________________________________________________________________  External Attachment:    Type:   Image     Comment:   External Document

## 2010-06-13 NOTE — Letter (Signed)
Summary: *HSN Results Follow up  [facility]  [address] [place], [place] [address]   Phone: [phone]  Fax: [phone]      04/25/2010   Susan Russell [address]  [address] [place], [place]  [address]   Dear  Susan Russell,                            ____S.Drinkard,FNP   ____D. Gore,FNP       ____B. McPherson,MD   ____V. Rankins,MD    ____E. Mulberry,MD    ____N. Daphine Deutscher, FNP  ____D. Reche Dixon, MD    ____K. Philipp Deputy, MD    ____Other     This letter is to inform you that your recent test(s):    ____X___Lab Test Results    Comments:  We have been trying to contact you at [phone].  Please contact the office at your earliest convenience.       _________________________________________________________ If you have any questions, please contact our office                     Sincerely,  Levon Hedger [facility]

## 2010-06-13 NOTE — Letter (Signed)
Summary: *HSN Results Follow up  [facility]  [address] [place], [place] [address]   Phone: [phone]  Fax: [phone]      05/03/2010   CRISTIANNA CYR [address]  [address] [place], [place]  [address]   Dear  Ms. Ziyonna Kopp,                            ____S.Drinkard,FNP   ____D. Gore,FNP       ____B. McPherson,MD   ____V. Rankins,MD    ____E. Mulberry,MD    ____N. Daphine Deutscher, FNP  ____D. Reche Dixon, MD    ____K. Philipp Deputy, MD    ____Other     This letter is to inform you that your recent test(s):  _______Pap Smear    ___X____Lab Test     _______X-ray    _______ is within acceptable limits  _______ requires a medication change  _______ requires a follow-up lab visit  _______ requires a follow-up visit with your provider   Comments:  We have been trying to reach you at [phone].  Please contact the offce at your earliest convenience.       _________________________________________________________ If you have any questions, please contact our office                     Sincerely,  Levon Hedger [facility]

## 2010-07-28 LAB — URINE MICROSCOPIC-ADD ON

## 2010-07-28 LAB — URINE CULTURE: Colony Count: >=100000

## 2010-07-28 LAB — URINALYSIS, ROUTINE W REFLEX MICROSCOPIC
Glucose, UA: NEGATIVE mg/dL
Ketones, ur: NEGATIVE mg/dL
Protein, ur: 100 mg/dL — AB
Urobilinogen, UA: 1 mg/dL (ref 0.0–1.0)

## 2010-07-28 LAB — CBC
HCT: 35.9 % — ABNORMAL LOW (ref 36.0–46.0)
MCHC: 34.6 g/dL (ref 30.0–36.0)
MCV: 86.7 fL (ref 78.0–100.0)
Platelets: 188 10*3/uL (ref 150–400)
RDW: 13.6 % (ref 11.5–15.5)
WBC: 15.6 10*3/uL — ABNORMAL HIGH (ref 4.0–10.5)

## 2010-07-28 LAB — DIFFERENTIAL
Eosinophils Absolute: 0 10*3/uL (ref 0.0–0.7)
Eosinophils Relative: 0 % (ref 0–5)
Lymphs Abs: 0.8 10*3/uL (ref 0.7–4.0)

## 2010-07-28 LAB — BASIC METABOLIC PANEL
BUN: 21 mg/dL (ref 6–23)
CO2: 23 mEq/L (ref 19–32)
Chloride: 103 mEq/L (ref 96–112)
Glucose, Bld: 157 mg/dL — ABNORMAL HIGH (ref 70–99)
Potassium: 3 mEq/L — ABNORMAL LOW (ref 3.5–5.1)

## 2011-05-20 ENCOUNTER — Other Ambulatory Visit (HOSPITAL_COMMUNITY): Payer: Self-pay | Admitting: Geriatric Medicine

## 2011-05-20 DIAGNOSIS — Z1231 Encounter for screening mammogram for malignant neoplasm of breast: Secondary | ICD-10-CM

## 2011-06-17 ENCOUNTER — Ambulatory Visit (HOSPITAL_COMMUNITY)
Admission: RE | Admit: 2011-06-17 | Discharge: 2011-06-17 | Disposition: A | Discharge status: Home / Self Care | Payer: Self-pay | Source: Ambulatory Visit | Attending: Geriatric Medicine | Admitting: Geriatric Medicine

## 2011-06-17 DIAGNOSIS — Z1231 Encounter for screening mammogram for malignant neoplasm of breast: Secondary | ICD-10-CM

## 2011-06-23 ENCOUNTER — Other Ambulatory Visit: Payer: Self-pay | Admitting: Geriatric Medicine

## 2011-06-23 DIAGNOSIS — R928 Other abnormal and inconclusive findings on diagnostic imaging of breast: Secondary | ICD-10-CM

## 2011-07-09 ENCOUNTER — Other Ambulatory Visit: Payer: Self-pay

## 2011-07-25 ENCOUNTER — Ambulatory Visit (INDEPENDENT_AMBULATORY_CARE_PROVIDER_SITE_OTHER): Payer: Self-pay | Admitting: *Deleted

## 2011-07-25 VITALS — BP 180/112 | HR 69 | Temp 97.0°F | Ht 59.5 in | Wt 151.8 lb

## 2011-07-25 DIAGNOSIS — Z1239 Encounter for other screening for malignant neoplasm of breast: Secondary | ICD-10-CM

## 2011-07-25 NOTE — Patient Instructions (Signed)
Taught patient how to perform BSE and gave educational materials to take home. Patient did not need a Pap smear today due to last Pap smear was January 2013 per patient. Let her know [facility] will cover Pap smears every 3 years unless has a history of abnormal Pap smears. Patient is scheduled for a left breast diagnostic mammogram next Friday, August 01, 2011 at 0950. Patient aware of appointment and will be there. Patients blood pressure in the office today was 190/100. Patient is being followed for hypertension by PCP. Patient stated she did not take her blood pressure medication. Told patient she needs to take her blood pressure medication. Gave patient sheet to fill out her blood pressures. Told patient to have her blood pressure taken several times and write down. Encouraged her to follow up with her PCP. Let patient know will follow up with her within the next couple weeks with results. Patient verbalized understanding.

## 2011-07-25 NOTE — Progress Notes (Signed)
Referred from the [facility] for additional imaging. Initial mammogram completed 06/17/11.  Pap Smear:    Pap smear not performed today. Per patient last Pap smear was January 2013 and normal. Patient cannot remember where she had last Pap smear. She stated it was at a clinic in [facility]. Per patient she has no history of abnormal Pap smears. The last Pap smear report in EPIC was 04/22/10 and normal.   Physical exam: Breasts Breasts symmetrical. No skin abnormalities bilateral breasts. No nipple retraction bilateral breasts. No nipple discharge bilateral breasts. No lymphadenopathy. No lumps palpated bilateral breasts. No complaints of pain or tenderness on exam. The Breast Center recommended additional imaging of the left breast.  Patient referred to the [facility] for Diagnostic Mammogram and possible left breast ultrasound Friday, August 01, 2011 at 0950.        Pelvic/Bimanual No Pap smear completed today since last Pap smear was January 2013 and normal per patient. Pap smear not indicated per [facility] guidelines.

## 2011-08-01 ENCOUNTER — Ambulatory Visit
Admission: RE | Admit: 2011-08-01 | Discharge: 2011-08-01 | Disposition: A | Discharge status: Home / Self Care | Payer: No Typology Code available for payment source | Source: Ambulatory Visit | Attending: Geriatric Medicine | Admitting: Geriatric Medicine

## 2011-08-01 DIAGNOSIS — R928 Other abnormal and inconclusive findings on diagnostic imaging of breast: Secondary | ICD-10-CM

## 2011-08-01 NOTE — Progress Notes (Signed)
Interpreter Wyvonnia Dusky for mammogram [facility] Center,

## 2012-07-09 ENCOUNTER — Emergency Department (HOSPITAL_COMMUNITY)
Admission: EM | Admit: 2012-07-09 | Discharge: 2012-07-09 | Disposition: A | Discharge status: Home / Self Care | Payer: Self-pay | Source: Home / Self Care

## 2012-07-09 ENCOUNTER — Encounter (HOSPITAL_COMMUNITY): Payer: Self-pay | Admitting: Emergency Medicine

## 2012-07-09 DIAGNOSIS — S335XXA Sprain of ligaments of lumbar spine, initial encounter: Secondary | ICD-10-CM

## 2012-07-09 DIAGNOSIS — M1710 Unilateral primary osteoarthritis, unspecified knee: Secondary | ICD-10-CM

## 2012-07-09 DIAGNOSIS — I10 Essential (primary) hypertension: Secondary | ICD-10-CM

## 2012-07-09 DIAGNOSIS — M1712 Unilateral primary osteoarthritis, left knee: Secondary | ICD-10-CM

## 2012-07-09 DIAGNOSIS — R3129 Other microscopic hematuria: Secondary | ICD-10-CM

## 2012-07-09 DIAGNOSIS — S39012A Strain of muscle, fascia and tendon of lower back, initial encounter: Secondary | ICD-10-CM

## 2012-07-09 LAB — POCT URINALYSIS DIP (DEVICE)
Glucose, UA: NEGATIVE mg/dL
Nitrite: NEGATIVE
Urobilinogen, UA: 1 mg/dL (ref 0.0–1.0)

## 2012-07-09 MED ORDER — NAPROXEN 375 MG PO TABS: 375.0000 mg | ORAL_TABLET | Freq: Two times a day (BID) | ORAL | Status: DC

## 2012-07-09 MED ORDER — CEPHALEXIN 500 MG PO CAPS: 500.0000 mg | ORAL_CAPSULE | Freq: Four times a day (QID) | ORAL | Status: DC

## 2012-07-09 MED ORDER — LISINOPRIL-HYDROCHLOROTHIAZIDE 10-12.5 MG PO TABS: 1.0000 | ORAL_TABLET | Freq: Every day | ORAL | Status: DC

## 2012-07-09 MED ORDER — TRAMADOL HCL 50 MG PO TABS: 50.0000 mg | ORAL_TABLET | Freq: Four times a day (QID) | ORAL | Status: DC | PRN

## 2012-07-09 NOTE — ED Notes (Signed)
Pt c/o right lower back pain x8 days Sx include: intermittent pain, dysuria, hurts to lay on right side, increases w/acitivity  Denies: inj/trauma, hematuria, f/v/n/d Works as a Social research officer, government and constantly bending over  Also c/o left knee pain Sx include: painful w/long periods of walking/standing Denies: inj/trauma, swelling  Has not had any meds today for the discomfort She is alert w/no signs of acute distress.

## 2012-07-09 NOTE — ED Provider Notes (Signed)
History     CSN: [id number]  Arrival date & time 07/09/12  1105   First MD Initiated Contact with Patient 07/09/12 1220      Chief Complaint  Patient presents with  . Back Pain    (Consider location/radiation/quality/duration/timing/severity/associated sxs/prior treatment) HPI Comments: -year-old Spanish female presents with a complaint of right low back pain for approximately one week. It is exacerbated by pulling bending and lifting. 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 She denies any known injury. It is worse with movements she has to make during work. Second complaint is that of left knee pain for 6 months. It is gradually getting worse. Worse with weightbearing. She points to the medial or lateral joint spaces as the area of pain. Denies any known injury. She is not taking any medication for discomfort. She has been diagnosed with hypertension and at one time placed on an antihypertensive. She no longer is taking this medication. She denies chest pain, shortness of breath or other cardiovascular or cerebrovascular symptoms.   Past Medical History  Diagnosis Date  . Hypertension   . Hyperlipidemia     Past Surgical History  Procedure Laterality Date  . Tubal ligation    . Ovarian cyst removal    . Eye surgery      No family history on file.  History  Substance Use Topics  . Smoking status: Never Smoker   . Smokeless tobacco: Never Used  . Alcohol Use: No    OB History   Grav Para Term Preterm Abortions TAB SAB Ect Mult Living   5 5 5       5       Review of Systems  Constitutional: Negative for fever, chills and activity change.  HENT: Negative.   Respiratory: Negative.   Cardiovascular: Negative.   Gastrointestinal: Negative.   Musculoskeletal:       As per HPI  Skin: Negative for color change, pallor and rash.  Neurological: Negative.   Hematological: Negative.     Allergies  Review of patient's  allergies indicates no known allergies.  Home Medications   Current Outpatient Rx  Name  Route  Sig  Dispense  Refill  . cephALEXin (KEFLEX) 500 MG capsule   Oral   Take 1 capsule (500 mg total) by mouth 4 (four) times daily.   28 capsule   0   . lisinopril-hydrochlorothiazide (PRINZIDE) 10-12.5 MG per tablet   Oral   Take 1 tablet by mouth daily.   30 tablet   0   . naproxen (NAPROSYN) 375 MG tablet   Oral   Take 1 tablet (375 mg total) by mouth 2 (two) times daily.   20 tablet   0   . traMADol (ULTRAM) 50 MG tablet   Oral   Take 1 tablet (50 mg total) by mouth every 6 (six) hours as needed for pain.   20 tablet   0     BP 244/93  Pulse 67  Temp(Src) 99 F (37.2 C) (Oral)  Resp 16  SpO2 97%  LMP 05/16/2012  Physical Exam  Nursing note and vitals reviewed. Constitutional: She is oriented to person, place, and time. She appears well-developed and well-nourished. No distress.  HENT:  Head: Normocephalic and atraumatic.  Eyes: EOM are normal. Pupils are equal, round, and reactive to light.  Neck: Normal range of motion. Neck supple.  Cardiovascular: Normal rate and normal heart sounds.   Pulmonary/Chest:  Effort normal.  Musculoskeletal: She exhibits tenderness. She exhibits no edema.  Tenderness in the right lower para lumbar spinal musculature. Pain is worse with bending forward in rotation of the torso. No spinal tenderness. Left knee with no is apparent swelling, overlying skin changes. No bony tenderness. Tenderness in the medial and lateral joint spaces. No bulging of the spaces, no apparent effusion. Extension and flexion range of motion are intact. Negative drawer. Negative varus valgus no laxity appreciated. Neurovascular motor sensory is intact.  Lymphadenopathy:    She has no cervical adenopathy.  Neurological: She is alert and oriented to person, place, and time. No cranial nerve deficit.  Skin: Skin is warm and dry.  Psychiatric: She has a normal mood  and affect.    ED Course  Procedures (including critical care time)  Labs Reviewed  POCT URINALYSIS DIP (DEVICE) - Abnormal; Notable for the following:    Hgb urine dipstick MODERATE (*)    Protein, ur 100 (*)    All other components within normal limits   No results found.   1. Strain of lumbar paraspinal muscle, initial encounter   2. Osteoarthritis of left knee   3. HTN (hypertension)   4. Microscopic hematuria       MDM  Drink plenty of fluids. Keflex 500 mg 4 times a day for 7 days Naprosyn 375 mg twice a day when necessary pain Tramadol 50 mg 1 every 6 hours as needed for pain Zestoretic 10/12-1/2 mg daily for blood pressure. Patient is admonished to seek primary care provider as soon as possible. She is given information on the [facility] and should obtain an appointment for an orange card and with a physician as soon as possible. In the meantime we will apply a knee sleeve to the left knee. She should limit her work such as bending pulling and lifting. Apply heat to her right low back.         Hayden Rasmussen, NP 07/09/12 1427

## 2012-07-09 NOTE — ED Provider Notes (Signed)
Medical screening examination/treatment/procedure(s) were performed by resident physician or non-physician practitioner and as supervising physician I was immediately available for consultation/collaboration.   Barkley Bruns MD.   Linna Hoff, MD 07/09/12 [phone]

## 2012-08-23 ENCOUNTER — Other Ambulatory Visit: Payer: Self-pay | Admitting: *Deleted

## 2012-08-23 DIAGNOSIS — N644 Mastodynia: Secondary | ICD-10-CM

## 2012-08-24 ENCOUNTER — Ambulatory Visit (HOSPITAL_COMMUNITY)
Admission: RE | Admit: 2012-08-24 | Discharge: 2012-08-24 | Disposition: A | Discharge status: Home / Self Care | Payer: Self-pay | Source: Ambulatory Visit | Attending: Obstetrics and Gynecology | Admitting: Obstetrics and Gynecology

## 2012-08-24 ENCOUNTER — Encounter (HOSPITAL_COMMUNITY): Payer: Self-pay

## 2012-08-24 ENCOUNTER — Other Ambulatory Visit: Payer: Self-pay | Admitting: Obstetrics and Gynecology

## 2012-08-24 ENCOUNTER — Other Ambulatory Visit (HOSPITAL_COMMUNITY): Payer: Self-pay | Admitting: Obstetrics and Gynecology

## 2012-08-24 VITALS — BP 180/100 | Temp 98.0°F | Ht 59.0 in | Wt 144.0 lb

## 2012-08-24 DIAGNOSIS — Z1239 Encounter for other screening for malignant neoplasm of breast: Secondary | ICD-10-CM

## 2012-08-24 DIAGNOSIS — Z1231 Encounter for screening mammogram for malignant neoplasm of breast: Secondary | ICD-10-CM

## 2012-08-24 NOTE — Patient Instructions (Signed)
Taught patient how to perform BSE. Patient did not need a Pap smear today due to last Pap smear was 04/21/2010. Let her know [facility] will cover Pap smears every 3 years unless has a history of abnormal Pap smears. Let patient know that her next Pap smear will be due December 2014 this December. Told her to call Martie Lee and can schedule a Pap smear through [facility]. Let patient know will follow up with her within the next couple weeks with results by letter or phone. Patient verbalized understanding. Patient escorted to mammography for a screening mammogram.

## 2012-08-24 NOTE — Progress Notes (Signed)
No complaints today.  Pap Smear:    Pap smear not completed today. Last Pap smear was 04/22/2010 and normal. Per patient no history of an abnormal Pap smear. Last Pap smear result is in EPIC.  Physical exam: Breasts Breasts symmetrical. No skin abnormalities bilateral breasts. No nipple retraction bilateral breasts. No nipple discharge bilateral breasts. No lymphadenopathy. No lumps palpated bilateral breasts. No complaints of pain or tenderness on exam. Patient escorted to mammography for a screening mammogram.        Pelvic/Bimanual No Pap smear completed today since last Pap smear was 04/22/2010. Pap smear not indicated per [facility] guidelines.

## 2012-09-03 ENCOUNTER — Other Ambulatory Visit: Payer: Self-pay

## 2012-09-03 ENCOUNTER — Ambulatory Visit: Payer: Self-pay | Admitting: Emergency Medicine

## 2012-09-03 VITALS — BP 214/98 | HR 66 | Temp 97.5°F | Resp 16 | Ht 59.0 in | Wt 144.0 lb

## 2012-09-03 DIAGNOSIS — I10 Essential (primary) hypertension: Secondary | ICD-10-CM

## 2012-09-03 DIAGNOSIS — Z9119 Patient's noncompliance with other medical treatment and regimen: Secondary | ICD-10-CM

## 2012-09-03 MED ORDER — LISINOPRIL 20 MG PO TABS: 20.0000 mg | ORAL_TABLET | Freq: Every day | ORAL | Status: DC

## 2012-09-03 MED ORDER — METOPROLOL SUCCINATE ER 50 MG PO TB24: 50.0000 mg | ORAL_TABLET | Freq: Every day | ORAL | Status: DC

## 2012-09-03 MED ORDER — HYDROCHLOROTHIAZIDE 25 MG PO TABS: 25.0000 mg | ORAL_TABLET | Freq: Every day | ORAL | Status: DC

## 2012-09-03 NOTE — Patient Instructions (Addendum)
Hipertensión  (Hypertension)  Cuando el corazón late bombea la sangre a través de las arterias. La fuerza que se origina es la presión arterial. Si la presión es demasiado elevada, se denomina hipertensión. El peligro radica en que puede sufrirla y no saberlo. Hipertensión puede significar que su corazón debe trabajar más intensamente para bombear sangre. Las arterias pueden estar estrechas o rígidas. El trabajo extra aumenta el riesgo de enfermedades cardíacas, ictus y otros problemas.   La presión arterial está formada por dos números: el número mayor sobre el número menor, por ejemplo110/70. Se señala "110/72". Los valores ideales son por debajo de 120 para el número más alto (sistólica) y por debajo de 80 para el más bajo (diastólica). Anote su presión sanguínea hoy.  Debe prestar mucha atención a su presión arterial si sufre alguna otra enfermedad como:  · Insuficiencia cardíaca  · Ataques cardiacos previos  · Diabetes  · Enfermedad renal crónica  · Ictus previo  · Múltiples factores de riesgo para enfermedades cardíacas  Para diagnosticar si usted sufre hipertensión arterial, debe medirse la presión mientras encuentra sentado con el brazo elevado a la altura del nivel del corazón. Debe medirse al menos 2 veces. Una única lectura de presión arterial elevada (especialmente en el servicio de emergencias) no significa que necesita tratamiento. Hay enfermedades en las que la presión arterial es diferente en ambos brazos. Es importante que consulte rápidamente con su médico para un control.  La mayoría de las personas sufren hipertensión esencial, lo que significa que no tiene una causa específica. Este tipo de hipertensión puede bajarse modificando algunos factores en el estilo de vida como:  · Estrés.  · El consumo de cigarrillos.  · La falta de actividad física.  · Peso excesivo  · Consumo de drogas y alcohol.  · Consumiendo menos sal  La mayoría de las personas no tienen síntomas hasta que la hipertensión  ocasiona un daño en el organismo. El tratamiento efectivo puede evitar, demorar o reducir ese daño.  TRATAMIENTO:  El tratamiento para la hipertensión, cuando se ha identificado una causa, está dirigido a la misma Hay un gran número en medicamentos para tratarla. Se agrupan en diferentes categorías y el médico seleccionará los medicamentos indicados para usted. Muchos medicamentos disponibles tienen efectos secundarios. Debe comentar los efectos secundarios con su médico.  Si la presión arterial permanece elevada después de modificar su estilo de vida o comenzar a tomar medicamentos:  · Los medicamentos deben ser reemplazados  · Puede ser necesario evaluar otros problemas.  · Debe estar seguro que comprende las indicaciones, que sabe cómo y cuándo tomar los medicamentos.  · Asegúrese de realizar un control con su médico dentro del tiempo indicado (generalmente dentro de las dos semanas) para volver a evaluar la presión arterial y revisar los medicamentos prescritos.  · Si está tomando más de un medicamento para la presión arterial, asegúrese que sabe cómo y en qué momentos debe tomarlos. Tomar los medicamentos al mismo tiempo puede dar como resultado un gran descenso en la presión arterial.  SOLICITE ATENCIÓN MÉDICA DE INMEDIATO SI PRESENTA:  · Dolor de cabeza intenso, visión borrosa o cambios en la visión, o confusión.  · Debilidad o adormecimientos inusuales o sensación de desmayo.  · Dolor de pecho o abdominal intenso, vómitos o problemas para respirar.  ASEGÚRESE QUE:   · Comprende estas instrucciones.  · Controlará su enfermedad.  · Solicitará ayuda inmediatamente si no mejora o si empeora.  Document Released: 04/28/2005 Document Revised: 07/21/2011  ExitCare® 

## 2012-09-03 NOTE — Progress Notes (Signed)
[facility] [address], [place] [address] [phone]  Date:  09/03/2012   Name:  Susan Russell   DOB:  08/20/1955   MRN:  [id number]  PCP:  No PCP Per Patient    Chief Complaint: Dizziness, Nausea and Gastrophageal Reflux   History of Present Illness:  Susan Russell is a 57 y.o. very pleasant female patient who presents with the following:  Patient has history of poorly controlled hypertension.  Was seen in [facility] in February and instructed to follow up as her blood pressure was 244/90+ at that time.  She is now complaining of a global headache after running out of medication. She is unclear about her last dose of medication being yesterday or today.  She has no chest pain, neurologic symptoms, nausea or vomiting. No shortness of breath.  Denies othe complaints.    Patient Active Problem List  Diagnosis  . HYPOKALEMIA  . DEPRESSION, MILD  . MITRAL REGURGITATION, MILD  . HYPERTENSION  . ALLERGIC RHINITIS CAUSE UNSPECIFIED  . GERD  . PAIN IN JOINT, HAND  . ROTATOR CUFF SYNDROME, LEFT  . MURMUR  . CHEST PAIN UNSPECIFIED  . NEPHROLITHIASIS, HX OF    Past Medical History  Diagnosis Date  . Hypertension   . Hyperlipidemia     Past Surgical History  Procedure Laterality Date  . Tubal ligation    . Ovarian cyst removal    . Eye surgery      History  Substance Use Topics  . Smoking status: Never Smoker   . Smokeless tobacco: Never Used  . Alcohol Use: No    History reviewed. No pertinent family history.  No Known Allergies  Medication list has been reviewed and updated.  Current Outpatient Prescriptions on File Prior to Visit  Medication Sig Dispense Refill  . cephALEXin (KEFLEX) 500 MG capsule Take 1 capsule (500 mg total) by mouth 4 (four) times daily.  28 capsule  0  . lisinopril-hydrochlorothiazide (PRINZIDE) 10-12.5 MG per tablet Take 1 tablet by mouth daily.  30 tablet  0  . naproxen (NAPROSYN) 375 MG tablet Take 1  tablet (375 mg total) by mouth 2 (two) times daily.  20 tablet  0  . traMADol (ULTRAM) 50 MG tablet Take 1 tablet (50 mg total) by mouth every 6 (six) hours as needed for pain.  20 tablet  0   No current facility-administered medications on file prior to visit.    Review of Systems:  As per HPI, otherwise negative.    Physical Examination: Filed Vitals:   09/03/12 1333  BP: 214/98  Pulse: 66  Temp: 97.5 F (36.4 C)  Resp: 16   Filed Vitals:   09/03/12 1333  Height: 4\' 11"  (1.499 m)  Weight: 144 lb (65.318 kg)   Body mass index is 29.07 kg/(m^2). Ideal Body Weight: Weight in (lb) to have BMI = 25: 123.5  GEN: WDWN, NAD, Non-toxic, A & O x 3 HEENT: Atraumatic, Normocephalic. Neck supple. No masses, No LAD. Ears and Nose: No external deformity. CV: RRR, No M/G/R. No JVD. No thrill. No extra heart sounds. PULM: CTA B, no wheezes, crackles, rhonchi. No retractions. No resp. distress. No accessory muscle use. ABD: S, NT, ND, +BS. No rebound. No HSM. EXTR: No c/c/e NEURO Normal gait.  PSYCH: Normally interactive. Conversant. Not depressed or anxious appearing.  Calm demeanor.    Assessment and Plan: Uncontrolled hypertension Noncompliance Lisinopril 20  HCTZ 25 Metoprolol 50 Follow up in one week.  Signed,  Ellison Carwin, MD

## 2013-05-03 ENCOUNTER — Encounter (HOSPITAL_COMMUNITY): Payer: Self-pay

## 2013-05-03 ENCOUNTER — Encounter (INDEPENDENT_AMBULATORY_CARE_PROVIDER_SITE_OTHER): Payer: Self-pay

## 2013-05-03 ENCOUNTER — Ambulatory Visit (HOSPITAL_COMMUNITY)
Admission: RE | Admit: 2013-05-03 | Discharge: 2013-05-03 | Disposition: A | Discharge status: Home / Self Care | Payer: Self-pay | Source: Ambulatory Visit | Attending: Obstetrics and Gynecology | Admitting: Obstetrics and Gynecology

## 2013-05-03 VITALS — BP 134/82 | Temp 98.0°F | Ht 60.0 in | Wt 149.6 lb

## 2013-05-03 DIAGNOSIS — Z01419 Encounter for gynecological examination (general) (routine) without abnormal findings: Secondary | ICD-10-CM

## 2013-05-03 NOTE — Patient Instructions (Signed)
Let her know [facility] will cover Pap smears every 3 years unless has a history of abnormal Pap smears. Let patient know will follow up with her within the next couple weeks with results. Monico Hoar verbalized understanding.  Sheera Illingworth, Kathaleen Maser, RN 9:25 AM

## 2013-05-03 NOTE — Progress Notes (Signed)
No complaints today.  Pap Smear:   Pap smear completed today. Last Pap smear was 04/22/2010 and normal. Per patient no history of an abnormal Pap smear. Last Pap smear result is in EPIC.   Pelvic/Bimanual   Ext Genitalia No lesions, no swelling and no discharge observed on external genitalia.         Vagina Vagina pink and normal texture. No lesions or discharge observed in vagina.          Cervix Cervix is present. Cervix pink and of normal texture. Cervix friable. No discharge observed.     Uterus Uterus is present and palpable. Uterus in normal position and normal size.        Adnexae Bilateral ovaries present and palpable. No tenderness on palpation.          Rectovaginal No rectal exam completed today since patient had no rectal complaints. No skin abnormalities observed on exam.

## 2013-05-16 ENCOUNTER — Telehealth (HOSPITAL_COMMUNITY): Payer: Self-pay | Admitting: *Deleted

## 2013-05-16 NOTE — Telephone Encounter (Signed)
Telephoned patient with interpreter Delorise RoyalsJulie Sowell and discussed negative pap smear results. Next pap smear due in 3 years. Patient voiced understanding.

## 2013-12-15 ENCOUNTER — Other Ambulatory Visit: Payer: Self-pay | Admitting: Obstetrics and Gynecology

## 2013-12-15 DIAGNOSIS — Z1231 Encounter for screening mammogram for malignant neoplasm of breast: Secondary | ICD-10-CM

## 2014-01-10 ENCOUNTER — Encounter (HOSPITAL_COMMUNITY): Payer: Self-pay

## 2014-01-10 ENCOUNTER — Ambulatory Visit (HOSPITAL_COMMUNITY)
Admission: RE | Admit: 2014-01-10 | Discharge: 2014-01-10 | Disposition: A | Discharge status: Home / Self Care | Payer: Self-pay | Source: Ambulatory Visit | Attending: Obstetrics and Gynecology | Admitting: Obstetrics and Gynecology

## 2014-01-10 VITALS — BP 120/90 | Temp 97.5°F | Ht 59.0 in | Wt 149.4 lb

## 2014-01-10 DIAGNOSIS — Z1239 Encounter for other screening for malignant neoplasm of breast: Secondary | ICD-10-CM

## 2014-01-10 DIAGNOSIS — Z1231 Encounter for screening mammogram for malignant neoplasm of breast: Secondary | ICD-10-CM

## 2014-01-10 NOTE — Progress Notes (Signed)
No complaints today.  Pap Smear:  Pap smear not completed today. Last Pap smear was 05/03/2013 at [facility] and normal. Per patient has no history of an abnormal Pap smear. Last Pap smear result is in EPIC.  Physical exam: Breasts Breasts symmetrical. No skin abnormalities bilateral breasts. No nipple retraction bilateral breasts. No nipple discharge bilateral breasts. No lymphadenopathy. No lumps palpated bilateral breasts. No complaints of pain or tenderness on exam. Patient escorted to mammography for a screening mammogram.        Pelvic/Bimanual No Pap smear completed today since last Pap smear was 05/03/2013. Pap smear not indicated per B[facility]CP guidelines.

## 2014-01-10 NOTE — Patient Instructions (Signed)
Explained to Monico Hoar that she did not need a Pap smear today due to last Pap smear was 05/03/2013. Let her know [facility] will cover Pap smears every 3 years unless has a history of abnormal Pap smears. Let patient know the [facility] will follow up with her within the next couple weeks with results by letter or phone. Monico Hoar verbalized understanding. Patient escorted to mammography for a screening mammogram.  Lauralee Waters, Kathaleen Maser, RN 11:25 AM

## 2014-01-27 ENCOUNTER — Ambulatory Visit (HOSPITAL_BASED_OUTPATIENT_CLINIC_OR_DEPARTMENT_OTHER): Payer: Self-pay

## 2014-01-27 ENCOUNTER — Other Ambulatory Visit: Payer: Self-pay

## 2014-01-27 VITALS — BP 136/84 | HR 60 | Temp 98.3°F | Resp 12 | Ht 58.75 in | Wt 150.6 lb

## 2014-01-27 DIAGNOSIS — Z0000 Encounter for general adult medical examination without abnormal findings: Secondary | ICD-10-CM

## 2014-01-27 LAB — GLUCOSE (CC13): GLUCOSE: 106 mg/dL (ref 70–140)

## 2014-01-27 LAB — HEMOGLOBIN A1C
Hgb A1c MFr Bld: 6.3 % — ABNORMAL HIGH (ref <5.7)
MEAN PLASMA GLUCOSE: 134 mg/dL — AB (ref <117)

## 2014-01-27 LAB — LIPID PANEL
CHOLESTEROL: 165 mg/dL (ref 0–200)
HDL: 40 mg/dL (ref >39)
LDL Cholesterol: 99 mg/dL (ref 0–99)
TRIGLYCERIDES: 129 mg/dL (ref <150)
Total CHOL/HDL Ratio: 4.1 Ratio
VLDL: 26 mg/dL (ref 0–40)

## 2014-01-27 NOTE — Patient Instructions (Signed)
Discussed health assessment with patient..Let patient know that will call her to follow up and if have any questions to call me.  Patient verbalized understanding. 

## 2014-01-27 NOTE — Progress Notes (Signed)
Patient is a new patient to the [facility] program and is currently a [facility] patient effective 12/13/2013.   Clinical Measurements: Patient is 4 ft. 11 inches, weight 196.7 lbs, waist circumference 44 inches, and hip circumference 51.5 inches.   Medical History: Patient has no history of high cholesterol. Patient does have a history of hypertension and Pre Diabetes. She is presently on Metformin, Benazepril, norvasc and HCTZ. Per patient has diagnosed history of coronary heart disease, heart attack, heart failure, stroke/TIA, vascular disease or congenital heart defects.   Blood Pressure, Self-measurement: Patient states that only gets blood pressure taken at doctor's office and has not been told to check blood pressure.  Nutrition Assessment: Patient stated that eats 2 fruits every day. Patient states she eats two servings of vegetables a week. Per patient eats 3 or more ounces of whole grains daily. Patient doesn't eat two or more servings of fish weekly. Patient states she does not drink more than 36 ounces or 450 calories of beverages with added sugars weekly. Patient stated she does watch her salt intake.   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 Patient does not do any vigorous activity.  Smoking Status: Patient quit smoking over 12 years ago and is not around smoke.  Quality of Life Assessment: In assessing patient's quality of life she stated that out of the past 30 days that she has felt her Physical health was good. Patient also stated that in the past 30 days that her mental health tincluding stress, depression and problems with emotions was good all 30 days. Patient did state that out of the past 30 days she felt her physical or mental health had not kept her from doing her usual activities including self-care, work or recreation.   Plan: Lab work will be done today including a lipid panel, blood glucose,  and Hgb A1C. Will call lab results when they are finished. Patient wants to work on Nutrition and Activity.

## 2014-01-30 ENCOUNTER — Telehealth: Payer: Self-pay

## 2014-01-30 NOTE — Telephone Encounter (Signed)
Called to inform about lab work from 01/27/14. Interpreter informed patient: cholesterol- 165, HDL- 40, LDL- 99, triglycerides - 129, Bld Glucose -106 and HBG-A1C - 6.4.  Set up health Coaching for October 2 at [facility]:30 AM at [facility] for nutrition and activity.

## 2014-02-10 ENCOUNTER — Ambulatory Visit: Payer: Self-pay

## 2014-02-10 DIAGNOSIS — Z789 Other specified health status: Secondary | ICD-10-CM

## 2014-02-10 NOTE — Patient Instructions (Signed)
Patient will follow 1600 calorie diet plan. Will review all handouts and exercise/activity book. Will increase exercise and use pedometer. Will measure portion sizes. Will call if has any questions. Will call patient in three weeks. Patient verbalized understanding. 

## 2014-02-10 NOTE — Progress Notes (Signed)
Patient returns today for Health Coaching regarding Nutrition for her borderline AIC and activity with interpreter.   NUTRITION: Patient and I went over Know Your Health Numbers again.Patient reviewed A1C handout to see about prediabetes, normal and  diabetes range. Patient went over what prediabetes is, complications that can result if do not get levels to normal, and diabetes level.Discussed increasing fiber in diet, reading labels and serving sizes. Discussed watching carbohydrates, how to count them, serving sizes and number of carbs per day allowance. Used plastic serving sizes to see portion sizes and what were starches, fruit and vegetables. Patient stated. Explained to patient and showed her that she is overweight and has BMI of 30.3. Patient went over 1,600 cal diet and we broke it down to the number of servings she could have. Patient received and reviewed the following handouts in Spanish: My Plate, Carb counting Menu, prediabetes, A1C Exam, Make half your grains whole. Gave patient measuring cup to measure serving sizes and demonstrated the serving sizes.  ACTIVITY: Discussed activity and walking. Patient discussed other activity. Patient received pedometer and was explained and shown how it works. Received and reviewed Exercise and Physical Activity [employer] book in Spanish.  Miscellaneous: Discussed where can get orange card and that I would be following up with her 3 more times because of Health Coaching ([facility]), and would rescreen next year.  PLAN: Pursue orange card or patient assisitance. Increasing amount of walking per day and add other exercises to plan, Decrease carbohydrates in diet. Lose weight. Follow up times three per phone unless needs and can call.

## 2014-03-13 ENCOUNTER — Encounter (HOSPITAL_COMMUNITY): Payer: Self-pay

## 2014-09-18 ENCOUNTER — Telehealth: Payer: Self-pay

## 2014-09-19 NOTE — Telephone Encounter (Signed)
Called per Delorise RoyalsJulie Sowell interpreter. Patient stated that was trying to decrease carbohydrates and Sweets. Discussed the importance of exercise and watching what is eating. Discussed that will recheck Hbg A1C This fall.

## 2014-10-25 ENCOUNTER — Telehealth: Payer: Self-pay

## 2014-10-25 NOTE — Telephone Encounter (Signed)
Patient returned call to [name] interpreter from 09/18/14. Patient stated that had been to see doctor and her cholesterol was good. Patient stated that would try  To get results from doctor. Patient said that Hgb A1C was between 5 and 6. Per patient she is doing do and eating better ( ).

## 2014-12-21 ENCOUNTER — Telehealth: Payer: Self-pay

## 2014-12-21 NOTE — Telephone Encounter (Addendum)
This is a follow up Assessment following Health Coaching with [facility]. Called per [name] interpreter. .  Medication Status : Patient states is  taking medication for hypertension and diabetes but not high cholesterol.   Blood Pressure, Self-measurement: Patient states that checks Blood pressure weekly and does not share with doctor.  Nutrition Assessment: Patient stated that eats 1 cup fruit every day. Patient states she eats 2 servings of vegetables a day. Per Patient does not eat 3 or more ounces of whole grains daily. Patient stated doesn't eat two or more servings of fish weekly.  Patient states she does not drink more than 36 ounces or 450 calories of beverages with added sugars weekly. Patient stated she does watch her salt intake.   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 Patient states does no vigorous per week.   Smoking Status: Patient has never smoked  And is not exposed to smoke.  Quality of Life Assessment: In assessing patient's quality of life she stated that out of the past 30 days that she has felt her health is good all of them. Patient also stated that in the past 30 days that her mental health was good including stress, depression and problems with emotions for all days. Patient did state that out of the past 30 days she felt her physical or mental health had not kept her from doing her usual activities including self-care, work or recreation.

## 2017-03-20 ENCOUNTER — Encounter (HOSPITAL_COMMUNITY): Payer: Self-pay

## 2017-05-26 ENCOUNTER — Encounter (HOSPITAL_COMMUNITY): Payer: Self-pay

## 2018-02-26 ENCOUNTER — Other Ambulatory Visit: Payer: Self-pay

## 2018-02-26 ENCOUNTER — Ambulatory Visit: Payer: Self-pay | Attending: Family Medicine | Admitting: Family Medicine

## 2018-02-26 ENCOUNTER — Encounter: Payer: Self-pay | Admitting: Family Medicine

## 2018-02-26 VITALS — BP 167/85 | HR 70 | Temp 98.0°F | Resp 18 | Ht 59.0 in | Wt 140.2 lb

## 2018-02-26 DIAGNOSIS — M25512 Pain in left shoulder: Secondary | ICD-10-CM

## 2018-02-26 DIAGNOSIS — I10 Essential (primary) hypertension: Secondary | ICD-10-CM

## 2018-02-26 DIAGNOSIS — Z90710 Acquired absence of both cervix and uterus: Secondary | ICD-10-CM | POA: Insufficient documentation

## 2018-02-26 DIAGNOSIS — Z780 Asymptomatic menopausal state: Secondary | ICD-10-CM

## 2018-02-26 DIAGNOSIS — M67912 Unspecified disorder of synovium and tendon, left shoulder: Secondary | ICD-10-CM

## 2018-02-26 MED ORDER — AMLODIPINE BESYLATE 5 MG PO TABS: 5.0000 mg | ORAL_TABLET | Freq: Every day | ORAL | 6 refills | Status: DC

## 2018-02-26 MED ORDER — PREDNISONE 20 MG PO TABS: ORAL_TABLET | ORAL | 0 refills | Status: DC

## 2018-02-26 MED ORDER — BENAZEPRIL HCL 40 MG PO TABS: 40.0000 mg | ORAL_TABLET | Freq: Every day | ORAL | 6 refills | Status: DC

## 2018-02-26 NOTE — Progress Notes (Signed)
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   Flu:    Patient requested a bone denisty test to be done.   [facility]  [place]ard

## 2018-02-26 NOTE — Patient Instructions (Signed)
Tendinitis del manguito rotador (Rotator Cuff Tendinitis) La tendinitis del manguito rotador es la inflamacin de las bandas duras, de aspecto de cordn, que conectan el msculo a los huesos (tendones) en el manguito rotador. El manguito rotador es el conjunto de todos los msculos y tendones que conectan el brazo al hombro. El manguito rotador sostiene la cabeza del hueso del brazo (hmero) en el hueco (fosa) del omplato (escpula). CAUSAS Con frecuencia, la tendinitis del manguito rotador se origina con el uso excesivo de la articulacin involucrada. SIGNOS Y SNTOMAS  Dolor intenso en el hombro, que se extiende a la parte externa del brazo sobre el msculo del hombro.  Punto de sensibilidad en el rea lesionada.  El dolor aparece gradualmente y empeora al levantar el brazo hacia un lado (abduccin) o al llevarlo hacia adentro (rotacin interna).  Puede producir un desgarro crnico: Cuando el tendn del manguito rotador se inflama, aparece el riesgo de no recibir el suministro de sangre y esto provoca la muerte de las fibras de los tendones. Esto aumenta el riesgo de que el tendn se deshilache o se desgarre por completo.  DIAGNSTICO La tendinitis del manguito rotador se diagnostica despus de hacer la historia clnica, un examen fsico y la revisin de los resultados de los estudios de diagnstico por imgenes. La historia clnica ayuda a determinar el tipo de lesin en el manguito rotador. El examen fsico incluir evaluar el hombro lesionado, palpar el rea y observarlo mientras hace ejercicios para determinar el rango de movimiento. Generalmente, se realizan radiografas para descartar otras causas del dolor en el hombro, como fracturas. Suele realizarse una resonancia magntica cuando la lesin en el hombro es significativa. A veces, se realiza un estudio con contraste llamado artrograma por tomografa computarizada, pero no es tan frecuente como la resonancia magntica. En algunas  instituciones, tambin pueden usarse ecografas especiales para ayudar a establecer el diagnstico. TRATAMIENTO Casos menos graves  Usar un cabestrillo para descansar el hombro durante un breve perodo. El uso prolongado del cabestrillo puede producir rigidez, debilidad y prdida del movimiento de la articulacin del hombro.  Es posible que le receten antinflamatorios, como ibuprofeno o naproxeno. Casos ms graves  Fisioterapia.  Inyecciones de corticoides en la articulacin del hombro.  Ciruga. INSTRUCCIONES PARA EL CUIDADO EN EL HOGAR  Use un cabestrillo o una frula hasta que mejore el dolor. El uso prolongado del cabestrillo puede producir rigidez, debilidad y prdida del movimiento de la articulacin del hombro.  Aplique hielo sobre la zona lesionada. ? Ponga el hielo en una bolsa plstica. ? Colquese una toalla entre la piel y la bolsa de hielo. ? Deje el hielo durante 20 minutos, 2 a 3 veces por da.  Mientras el tendn le cause dolor, evite todo rango de movimiento que no sea moderado. Use el hombro y haga ejercicios solamente segn las indicaciones de su mdico. Suspenda los ejercicios o reduzca el rango de movimiento si el dolor o las molestias aumentan, a menos que el mdico le indique otra cosa.  Utilice los medicamentos de venta libre o recetados para calmar el dolor, el malestar o la fiebre, segn se lo indique el mdico.  Si le han inmovilizado el brazo (con un cabestrillo y tirantes), no los retire, excepto que su mdico se lo haya indicado o hasta que vea a su mdico para la visita de control. Si necesita quitarlos, mueva el brazo lo menos posible.  Podr dormir sobre varias almohadas para disminuir la hinchazn y el dolor.  SOLICITE ATENCIN   MDICA DE INMEDIATO SI:  El dolor en el hombro [place], o siente un nuevo dolor en el brazo, en la mano o en los dedos que no se alivia con medicamentos.  Presenta sntomas nuevos o desconocidos, especialmente mayor  adormecimiento en las manos o prdida de fuerza.  Empeoran los problemas que lo llevaron a la consulta con el mdico.  El brazo, la mano o los dedos estn adormecidos o siente hormigueos.  El brazo, la mano o los dedos estn hinchados, le duelen o se ven blancos o azules  ASEGRESE DE QUE:  Comprende estas instrucciones.  Controlar su afeccin.  Recibir ayuda de inmediato si no mejora o si empeora.  Esta informacin no tiene Theme park managercomo fin reemplazar el consejo del mdico. Asegrese de hacerle al mdico cualquier pregunta que tenga. Document Released: 08/14/2008 Document Revised: 05/19/2014 Document Reviewed: 12/08/2012 Elsevier Interactive Patient Education  2017 [employer]

## 2018-02-26 NOTE — Progress Notes (Signed)
Subjective:    Patient ID: Susan Russell, female    DOB: 1955/07/10, 62 y.o.   MRN: [id number]   Due to language barrier, a video interpreter was used at today's visit  HPI 62 yo female new to the practice.  Patient reports that she has had greater than 1 month of pain which radiates from her left shoulder area down her left arm.  Patient has increased pain when she tries to lift anything or if she tries to lift her arm.  Patient states that she cannot lift her arm above her head.  Patient states that she saw another doctor who did blood test and told her that she had inflammation in her muscles.  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  Patient does not recall any specific injury to the left arm or shoulder.  Patient does have some occasional numbness in both hands.  Numbness tends to occur while she is sleeping and patient states that if she rubs her hands or shakes her hands then they feel better.  Patient also requests testing of her bones.  Patient states that she is now 7 and would like to have evaluation done for her bones.  Patient also has a history of hypertension but reports that she forgot to take her blood pressure medication today.  Patient is currently on amlodipine and benazepril and states that her blood pressures have been controlled on these medications.  Patient will need a prescription refill for these medications.      Patient reports that she has no known drug allergies.  Patient reports a past surgical history of eye surgery as well as hysterectomy.  Patient states that the hysterectomy was done secondary to the presence of a cyst.  Patient does not recall any significant family history.    Review of Systems  Constitutional: Positive for fatigue. Negative for chills, diaphoresis and fever.  Respiratory: Negative for cough and shortness of breath.   Cardiovascular: Negative for chest pain, palpitations and leg swelling.  Gastrointestinal: Negative for abdominal  pain and nausea.  Genitourinary: Negative for dysuria and frequency.  Musculoskeletal: Positive for arthralgias, back pain and myalgias. Negative for gait problem and joint swelling.  Neurological: Positive for numbness. Negative for dizziness, light-headedness and headaches.       Objective:   Physical Exam BP (!) 167/85   Pulse 70   Temp 98 F (36.7 C) (Oral)   Resp 18   Ht 4\' 11"  (1.499 m)   Wt 140 lb 3.2 oz (63.6 kg)   LMP 07/14/2012   SpO2 98%   BMI 28.32 kg/m Nurse's notes and vital signs reviewed. General-well-nourished, well-developed older female in no acute distress Neck-supple, patient does have some mild left-sided cervical paraspinous spasm and bilateral upper back/trapezius area spasm, no lymphadenopathy, no thyromegaly Lungs-clear to auscultation bilaterally Cardiovascular-regular rate and rhythm Abdomen-soft, nontender Back-no CVA tenderness Extremities-no edema Musculoskeletal- patient with tenderness to palpation over the lateral posterior and anterior shoulder.  Patient with inability to raise her left arm above horizontal at the shoulder level.  Patient with positive impingement sign/empty can sign at the left shoulder.  Patient also with pain with horizontal range of motion of the left arm at the shoulder level. Negative Tinel at the wrists bilaterally      Assessment & Plan:  1. Essential hypertension Patient is provided with refills of her current blood pressure medications, amlodipine and benazepril.  Patient is encouraged to monitor her blood pressure and to follow a low-sodium  diet. - amLODipine (NORVASC) 5 MG tablet; Take 1 tablet (5 mg total) by mouth daily. To lower blood pressure  Dispense: 30 tablet; Refill: 6 - benazepril (LOTENSIN) 40 MG tablet; Take 1 tablet (40 mg total) by mouth daily. To lower blood pressure  Dispense: 30 tablet; Refill: 6  2. Tendinopathy of left rotator cuff Discussed with the patient that she likely has  tendinitis/tendinopathy of her left rotator cuff muscles.  Patient is being referred to physical therapy for further evaluation and treatment. - Ambulatory referral to Physical Therapy  3. Acute pain of left shoulder Discussed with the patient that her shoulder pain is likely related to rotator cuff tendinopathy of the left shoulder.  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  Patient is being referred to physical therapy and patient is given a prednisone taper to help with acute pain and inflammation.  Patient may take over-the-counter nonsteroidal anti-inflammatories or Tylenol as needed - Ambulatory referral to Physical Therapy - predniSONE (DELTASONE) 20 MG tablet; 2 pills once per day on the first 2 days then 1 pill daily x 2 days then 1/2 pill daily for 4 days; eat before taking medication  Dispense: 8 tablet; Refill: 0  4. Postmenopausal Patient is postmenopausal and is concerned about her bone health.  Patient will be scheduled for bone density scan and patient will be notified of the results.  Patient is encouraged to take at thousand international units of vitamin D daily and 400 mg twice daily of calcium as well as continuation of weightbearing exercise such as walking - DG Bone Density; Future  *Patient was offered influenza immunization which she declined at today's visit **Review of chart, patient has past medical history significant for prediabetes as she had a hemoglobin A1c of 6.3 in 2015.  Patient will have repeat hemoglobin A1c at her next visit.  An After Visit Summary was printed and given to the patient.  Return in about 4 months (around 06/29/2018) for HTN/shoulder pain-sooner if needed.

## 2018-03-01 ENCOUNTER — Encounter: Payer: Self-pay | Admitting: Family Medicine

## 2018-03-11 ENCOUNTER — Ambulatory Visit
Admission: RE | Admit: 2018-03-11 | Discharge: 2018-03-11 | Disposition: A | Discharge status: Home / Self Care | Payer: No Typology Code available for payment source | Source: Ambulatory Visit | Attending: Family Medicine | Admitting: Family Medicine

## 2018-03-11 DIAGNOSIS — Z780 Asymptomatic menopausal state: Secondary | ICD-10-CM

## 2018-03-22 ENCOUNTER — Telehealth: Payer: Self-pay | Admitting: *Deleted

## 2018-03-22 NOTE — Telephone Encounter (Signed)
Medical Assistant used [employer] to contact patient.  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 Patient is aware of bone density being normal. Patient advised to follow up as planned.

## 2018-03-22 NOTE — Telephone Encounter (Signed)
-----   Message from Cain Saupe, MD sent at 03/18/2018 12:05 AM EST ----- Please notify patient that her bone density scan was considered normal.  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

## 2018-06-29 ENCOUNTER — Ambulatory Visit: Payer: Self-pay | Admitting: Family Medicine

## 2018-07-26 ENCOUNTER — Ambulatory Visit: Payer: Self-pay | Attending: Family Medicine | Admitting: Family Medicine

## 2018-07-26 ENCOUNTER — Encounter: Payer: Self-pay | Admitting: Family Medicine

## 2018-07-26 ENCOUNTER — Ambulatory Visit: Payer: Self-pay | Attending: Family Medicine

## 2018-07-26 ENCOUNTER — Other Ambulatory Visit: Payer: Self-pay

## 2018-07-26 VITALS — BP 138/79 | HR 69 | Temp 98.2°F | Resp 18 | Ht 59.0 in | Wt 146.0 lb

## 2018-07-26 DIAGNOSIS — I10 Essential (primary) hypertension: Secondary | ICD-10-CM

## 2018-07-26 DIAGNOSIS — M25512 Pain in left shoulder: Secondary | ICD-10-CM

## 2018-07-26 DIAGNOSIS — R7303 Prediabetes: Secondary | ICD-10-CM

## 2018-07-26 DIAGNOSIS — R1013 Epigastric pain: Secondary | ICD-10-CM

## 2018-07-26 DIAGNOSIS — W540XXA Bitten by dog, initial encounter: Secondary | ICD-10-CM

## 2018-07-26 DIAGNOSIS — S61451A Open bite of right hand, initial encounter: Secondary | ICD-10-CM

## 2018-07-26 DIAGNOSIS — G8929 Other chronic pain: Secondary | ICD-10-CM

## 2018-07-26 DIAGNOSIS — R1011 Right upper quadrant pain: Secondary | ICD-10-CM

## 2018-07-26 DIAGNOSIS — Z23 Encounter for immunization: Secondary | ICD-10-CM

## 2018-07-26 DIAGNOSIS — Z79899 Other long term (current) drug therapy: Secondary | ICD-10-CM

## 2018-07-26 LAB — POCT GLYCOSYLATED HEMOGLOBIN (HGB A1C): Hemoglobin A1C: 5.8 % — AB (ref 4.0–5.6)

## 2018-07-26 MED ORDER — CEPHALEXIN 500 MG PO CAPS: 500.0000 mg | ORAL_CAPSULE | Freq: Three times a day (TID) | ORAL | 0 refills | Status: DC

## 2018-07-26 MED ORDER — TETANUS-DIPHTH-ACELL PERTUSSIS 5-2.5-18.5 LF-MCG/0.5 IM SUSP: 0.5000 mL | Freq: Once | INTRAMUSCULAR | 0 refills | Status: AC

## 2018-07-26 MED ORDER — AMLODIPINE BESYLATE 5 MG PO TABS: 5.0000 mg | ORAL_TABLET | Freq: Every day | ORAL | 6 refills | Status: DC

## 2018-07-26 MED ORDER — FAMOTIDINE 20 MG PO TABS: 20.0000 mg | ORAL_TABLET | Freq: Two times a day (BID) | ORAL | 6 refills | Status: DC

## 2018-07-26 MED ORDER — BENAZEPRIL HCL 40 MG PO TABS: 40.0000 mg | ORAL_TABLET | Freq: Every day | ORAL | 6 refills | Status: DC

## 2018-07-26 NOTE — Progress Notes (Signed)
Established Patient Office Visit  Subjective:  Patient ID: Susan Russell, female    DOB: February 28, 1956  Age: 63 y.o. MRN: [id number]   Due to a language barrier, patient is accompanied by a friend who wishes to act as patient's interpreter.  Patient does speak English but needed assistance with some information/phrases.  CC:  Chief Complaint  Patient presents with  . Follow-up    HPI Susan Russell presents for follow-up of hypertension and left shoulder pain.  Patient states that she continues to take her blood pressure medications daily and she denies any headaches or dizziness related to her blood pressure.  Patient feels that her blood pressures controlled on her current meds.  Patient states that she continues to have pain in her left shoulder and difficulty lifting her left arm/shoulder.  Patient states that when she was last here she had requested a referral to a chiropractor but states that she never heard anything back regarding a chiropractic referral.  Patient denies any numbness or tingling in her left arm or hand.       Patient reports that last week, she was bitten on her right hand by her dog.  She continues to have some mild pain in her right hand and mild swelling.  Patient did call for an appointment but was unable to be seen in the office until today.  Patient reports that she did report the dog bite to animal control and she also states that she was told to have her tetanus immunization done at today's visit.  Patient reports that her dogs immunizations are up-to-date.  Patient has had no fever or chills.  Patient does not believe that she has had any redness to the area of her hand that was bitten, just pain which is improving as well as swelling.      Patient reports that earlier this year she had onset of nausea vomiting and diarrhea which lasted for a few hours and patient states that a year ago she had similar symptoms.  Patient wonders what causes her to have  these episodes of nausea vomiting and diarrhea.  Upon questioning, patient does have occasional issues with pain in her right upper abdomen.  Patient has not really sure if the pain in her upper abdomen is related to eating.  Patient does have occasional nausea and mid upper abdominal discomfort.      Patient recalls being told in the past that she was prediabetic.  Patient has never been on any medication to help with her blood sugars in the past.  She denies any symptoms of increased thirst, no blurred vision and no urinary frequency.  Past Medical History:  Diagnosis Date  . History of prediabetes    Patient had hemoglobin A1c of 6.3 in 2015 per chart  . Hyperlipidemia   . Hypertension     Past Surgical History:  Procedure Laterality Date  . EYE SURGERY    . OVARIAN CYST REMOVAL    . TUBAL LIGATION      History reviewed. No pertinent family history.  Social History   Socioeconomic History  . Marital status: Divorced    Spouse name: Not on file  . Number of children: Not on file  . Years of education: Not on file  . Highest education level: Not on file  Occupational History  . Not on file  Social Needs  . Financial resource strain: Not on file  . Food insecurity:    Worry: Not on file  Inability: Not on file  . Transportation needs:    Medical: Not on file    Non-medical: Not on file  Tobacco Use  . Smoking status: Never Smoker  . Smokeless tobacco: Never Used  Substance and Sexual Activity  . Alcohol use: No  . Drug use: No  . Sexual activity: Yes    Birth control/protection: Surgical  Lifestyle  . Physical activity:    Days per week: Not on file    Minutes per session: Not on file  . Stress: Not on file  Relationships  . Social connections:    Talks on phone: Not on file    Gets together: Not on file    Attends religious service: Not on file    Active member of club or organization: Not on file    Attends meetings of clubs or organizations: Not on file      Relationship status: Not on file  . Intimate partner violence:    Fear of current or ex partner: Not on file    Emotionally abused: Not on file    Physically abused: Not on file    Forced sexual activity: Not on file  Other Topics Concern  . Not on file  Social History Narrative  . Not on file    Outpatient Medications Prior to Visit  Medication Sig Dispense Refill  . amLODipine (NORVASC) 5 MG tablet Take 1 tablet (5 mg total) by mouth daily. To lower blood pressure 30 tablet 6  . benazepril (LOTENSIN) 40 MG tablet Take 1 tablet (40 mg total) by mouth daily. To lower blood pressure 30 tablet 6  . naproxen (NAPROSYN) 375 MG tablet Take 1 tablet (375 mg total) by mouth 2 (two) times daily. 20 tablet 0  . cephALEXin (KEFLEX) 500 MG capsule Take 1 capsule (500 mg total) by mouth 4 (four) times daily. (Patient not taking: Reported on 02/26/2018) 28 capsule 0  . hydrochlorothiazide (HYDRODIURIL) 25 MG tablet Take 1 tablet (25 mg total) by mouth daily. (Patient not taking: Reported on 02/26/2018) 30 tablet 1  . lisinopril (PRINIVIL,ZESTRIL) 20 MG tablet Take 1 tablet (20 mg total) by mouth daily. (Patient not taking: Reported on 02/26/2018) 30 tablet 1  . lisinopril-hydrochlorothiazide (PRINZIDE) 10-12.5 MG per tablet Take 1 tablet by mouth daily. (Patient not taking: Reported on 02/26/2018) 30 tablet 0  . metoprolol succinate (TOPROL-XL) 50 MG 24 hr tablet Take 1 tablet (50 mg total) by mouth daily. Take with or immediately following a meal. (Patient not taking: Reported on 02/26/2018) 30 tablet 1  . predniSONE (DELTASONE) 20 MG tablet 2 pills once per day on the first 2 days then 1 pill daily x 2 days then 1/2 pill daily for 4 days; eat before taking medication 8 tablet 0  . traMADol (ULTRAM) 50 MG tablet Take 1 tablet (50 mg total) by mouth every 6 (six) hours as needed for pain. (Patient not taking: Reported on 02/26/2018) 20 tablet 0   No facility-administered medications prior to visit.      No Known Allergies  ROS Review of Systems  Constitutional: Negative for chills, fatigue and fever.  HENT: Negative for sore throat and trouble swallowing.   Eyes: Negative for photophobia and visual disturbance.  Respiratory: Negative for cough and shortness of breath.   Cardiovascular: Negative for chest pain, palpitations and leg swelling.  Gastrointestinal: Positive for abdominal pain (Occasional), diarrhea (Occasional) and nausea (Occasional). Negative for constipation.  Endocrine: Negative for polydipsia, polyphagia and polyuria.  Genitourinary: Negative for  dysuria and frequency.  Musculoskeletal: Positive for arthralgias and back pain.  Neurological: Negative for dizziness and headaches.  Hematological: Negative for adenopathy. Does not bruise/bleed easily.      Objective:    Physical Exam  Constitutional: She is oriented to person, place, and time. She appears well-developed and well-nourished.  Neck: Normal range of motion. Neck supple. No thyromegaly present.  Cardiovascular: Normal rate and regular rhythm.  Pulmonary/Chest: Effort normal and breath sounds normal.  Abdominal: Soft. There is abdominal tenderness (right upper quadrant tenderness to exam). There is no rebound and no guarding.  Musculoskeletal:     Left shoulder: She exhibits decreased range of motion and tenderness.       Arms:  Lymphadenopathy:    She has no cervical adenopathy.  Neurological: She is alert and oriented to person, place, and time.  Skin:  Patient with healing puncture wounds to the radial side of the right pointer finger. Some mild edema of the finger and tenderness at the sites of the puncture wounds  Psychiatric: She has a normal mood and affect. Her behavior is normal. Judgment and thought content normal.    BP 138/79 (BP Location: Left Arm, Patient Position: Sitting, Cuff Size: Normal)   Pulse 69   Temp 98.2 F (36.8 C) (Oral)   Resp 18   Ht 4\' 11"  (1.499 m)   Wt 146 lb  (66.2 kg)   LMP 07/14/2012   SpO2 100%   BMI 29.49 kg/m  Wt Readings from Last 3 Encounters:  07/26/18 146 lb (66.2 kg)  02/26/18 140 lb 3.2 oz (63.6 kg)  01/27/14 150 lb 9.6 oz (68.3 kg)     Health Maintenance Due  Topic Date Due  . Hepatitis C Screening  Sep [birth year], 1956  . HIV Screening  01/29/1972  . TETANUS/TDAP  01/29/1976  . COLONOSCOPY  01/29/2007  . MAMMOGRAM  01/11/2016  . PAP SMEAR-Modifier  05/03/2016  . INFLUENZA VACCINE  12/10/2017      Lab Results  Component Value Date   TSH 1.287 04/22/2010   Lab Results  Component Value Date   WBC 6.6 04/22/2010   HGB 11.3 (L) 04/22/2010   HCT 32.7 (L) 04/22/2010   MCV 84.1 04/22/2010   PLT 209 04/22/2010   Lab Results  Component Value Date   NA 141 04/22/2010   K 3.7 04/22/2010   CO2 27 04/22/2010   GLUCOSE 106 01/27/2014   BUN 14 04/22/2010   CREATININE 0.54 04/22/2010   BILITOT 0.6 04/22/2010   ALKPHOS 49 04/22/2010   AST 17 04/22/2010   ALT 13 04/22/2010   PROT 7.0 04/22/2010   ALBUMIN 4.0 04/22/2010   CALCIUM 8.9 04/22/2010   Lab Results  Component Value Date   CHOL 165 01/27/2014   Lab Results  Component Value Date   HDL 40 01/27/2014   Lab Results  Component Value Date   LDLCALC 99 01/27/2014   Lab Results  Component Value Date   TRIG 129 01/27/2014   Lab Results  Component Value Date   CHOLHDL 4.1 01/27/2014   Lab Results  Component Value Date   HGBA1C 6.3 (H) 01/27/2014      Assessment & Plan:  1. Essential hypertension Blood pressure is reasonably controlled on current medication regimen of benazepril and amlodipine which she is to continue refill provided at today's visit. Continue DASH diet and regular exercise - Comprehensive metabolic panel - benazepril (LOTENSIN) 40 MG tablet; Take 1 tablet (40 mg total) by mouth daily for 30 days. To  lower blood pressure  Dispense: 30 tablet; Refill: 6 - amLODipine (NORVASC) 5 MG tablet; Take 1 tablet (5 mg total) by mouth daily. To lower  blood pressure  Dispense: 30 tablet; Refill: 6  2. Prediabetes Patient with pre-diabetes based on prior Hgb A1c of 6.3 done 01/27/2014. A1c was repeated at today's visit and improved at 5.8 however patient still with an increased risk of diabetes and she should continue a low carb diet and regular exercise - HgB A1c - Comprehensive metabolic panel  3. Dog bite of hand without complication, right, initial encounter Patient reports that she was recently bitten by her pet.  Patient reports that her dog's immunizations are up-to-date and that she has reported this to animal control.  Patient still with some tenderness to the right hand at the area of the dog bite and patient will be placed on Keflex 500 mg 3 times daily x7 days and patient should call or return if her hand pain continues or she has any increased warmth, swelling or concerns - cephALEXin (KEFLEX) 500 MG capsule; Take 1 capsule (500 mg total) by mouth 3 (three) times daily.  Dispense: 21 capsule; Refill: 0  4. Right upper quadrant pain Patient with right upper quadrant pain on exam and patient has had some recurrent nausea vomiting diarrhea over the past 2 years.  Patient will have an ultrasound of the right upper quadrant to look for liver or gallbladder dysfunction and CMP to check for electrolyte or liver enzyme abnormality. - Comprehensive metabolic panel - US Abdomen Limited RUQ; Future  5. Encounter for long-term current use of medication Patient will have CMP in follow-up of long-term use of medications as well as history of prediabetes - Comprehensive metabolic panel  6. Chronic left shoulder pain Patient with chronic left shoulder pain with decreased range of motion.  Patient will be referred to orthopedics for further evaluation and treatment - AMB referral to orthopedics  7. Dyspepsia RX for pepcid 20 mg twice per day.  Patient will also have right upper quadrant ultrasound to rule out gallbladder dysfunction/gallstones  as a cause of her dyspepsia.  8. Need for Tdap vaccination Patient s/p dog bite and tetanus immunization provided  Follow-up: Return in about 4 months (around 11/25/2018) for abd pain/prediabetes-sooner if needed.   Cain Saupe, MD

## 2018-07-27 LAB — COMPREHENSIVE METABOLIC PANEL WITH GFR
ALT: 14 IU/L (ref 0–32)
AST: 17 IU/L (ref 0–40)
Albumin/Globulin Ratio: 1.4 (ref 1.2–2.2)
Albumin: 4.2 g/dL (ref 3.8–4.8)
Alkaline Phosphatase: 64 IU/L (ref 39–117)
BUN/Creatinine Ratio: 36 — ABNORMAL HIGH (ref 12–28)
BUN: 19 mg/dL (ref 8–27)
Bilirubin Total: 0.6 mg/dL (ref 0.0–1.2)
CO2: 22 mmol/L (ref 20–29)
Calcium: 9.3 mg/dL (ref 8.7–10.3)
Chloride: 105 mmol/L (ref 96–106)
Creatinine, Ser: 0.53 mg/dL — ABNORMAL LOW (ref 0.57–1.00)
GFR calc Af Amer: 119 mL/min/1.73
GFR calc non Af Amer: 103 mL/min/1.73
Globulin, Total: 3 g/dL (ref 1.5–4.5)
Glucose: 85 mg/dL (ref 65–99)
Potassium: 3.7 mmol/L (ref 3.5–5.2)
Sodium: 141 mmol/L (ref 134–144)
Total Protein: 7.2 g/dL (ref 6.0–8.5)

## 2018-07-30 ENCOUNTER — Ambulatory Visit (HOSPITAL_COMMUNITY): Payer: No Typology Code available for payment source

## 2018-07-30 ENCOUNTER — Telehealth: Payer: Self-pay | Admitting: *Deleted

## 2018-07-30 NOTE — Telephone Encounter (Signed)
-----   Message from Cain Saupe, MD sent at 07/27/2018  4:57 PM EDT ----- Notify patient of normal complete metabolic panel

## 2018-07-30 NOTE — Telephone Encounter (Signed)
Medical Assistant used [employer] to contact patient.  Interpreter Name: Riki Sheer #: [id number] Medical Assistant left message on patient's home and cell voicemail. Voicemail states to give a call back to [place] with [facility] at [phone]. Patient is aware of labs being normal

## 2018-08-02 ENCOUNTER — Ambulatory Visit: Payer: No Typology Code available for payment source

## 2018-08-30 ENCOUNTER — Other Ambulatory Visit: Payer: Self-pay

## 2018-08-30 ENCOUNTER — Ambulatory Visit (HOSPITAL_COMMUNITY)
Admission: RE | Admit: 2018-08-30 | Discharge: 2018-08-30 | Disposition: A | Discharge status: Home / Self Care | Payer: No Typology Code available for payment source | Source: Ambulatory Visit | Attending: Family Medicine | Admitting: Family Medicine

## 2018-08-30 DIAGNOSIS — R1011 Right upper quadrant pain: Secondary | ICD-10-CM | POA: Insufficient documentation

## 2018-11-25 ENCOUNTER — Ambulatory Visit: Payer: No Typology Code available for payment source | Admitting: Family Medicine

## 2018-12-13 ENCOUNTER — Telehealth: Payer: Self-pay | Admitting: Family Medicine

## 2018-12-13 NOTE — Telephone Encounter (Signed)
1) Medication(s) Requested (by name): -amLODipine (NORVASC) 5 MG tablet  -cephALEXin (KEFLEX) 500 MG capsule  -famotidine (PEPCID) 20 MG tablet   2) Pharmacy of Choice: -[facility] ([facility]), [place] - [address] 3) Special Requests:   Approved medications will be sent to the pharmacy, we will reach out if there is an issue.  Requests made after 3pm may not be addressed until the following business day!  If a patient is unsure of the name of the medication(s) please note and ask patient to call back when they are able to provide all info, do not send to responsible party until all information is available!

## 2018-12-15 NOTE — Telephone Encounter (Signed)
Pt has plenty of her refills of amlodipine and famotidine at her pharmacy, cephalexin will not be refilled until the patient is seen by a provider.

## 2018-12-20 ENCOUNTER — Other Ambulatory Visit: Payer: Self-pay

## 2018-12-20 ENCOUNTER — Encounter (HOSPITAL_COMMUNITY): Payer: Self-pay

## 2018-12-20 ENCOUNTER — Emergency Department (HOSPITAL_COMMUNITY)
Admission: EM | Admit: 2018-12-20 | Discharge: 2018-12-20 | Disposition: A | Discharge status: Home / Self Care | Payer: No Typology Code available for payment source | Attending: Emergency Medicine | Admitting: Emergency Medicine

## 2018-12-20 DIAGNOSIS — R7303 Prediabetes: Secondary | ICD-10-CM | POA: Insufficient documentation

## 2018-12-20 DIAGNOSIS — S0990XA Unspecified injury of head, initial encounter: Secondary | ICD-10-CM | POA: Diagnosis present

## 2018-12-20 DIAGNOSIS — Y9389 Activity, other specified: Secondary | ICD-10-CM | POA: Insufficient documentation

## 2018-12-20 DIAGNOSIS — Z79899 Other long term (current) drug therapy: Secondary | ICD-10-CM | POA: Diagnosis not present

## 2018-12-20 DIAGNOSIS — S0181XA Laceration without foreign body of other part of head, initial encounter: Secondary | ICD-10-CM | POA: Diagnosis not present

## 2018-12-20 DIAGNOSIS — Y9289 Other specified places as the place of occurrence of the external cause: Secondary | ICD-10-CM | POA: Diagnosis not present

## 2018-12-20 DIAGNOSIS — W228XXA Striking against or struck by other objects, initial encounter: Secondary | ICD-10-CM | POA: Diagnosis not present

## 2018-12-20 DIAGNOSIS — I10 Essential (primary) hypertension: Secondary | ICD-10-CM | POA: Diagnosis not present

## 2018-12-20 DIAGNOSIS — Y990 Civilian activity done for income or pay: Secondary | ICD-10-CM | POA: Insufficient documentation

## 2018-12-20 MED ORDER — BACITRACIN ZINC 500 UNIT/GM EX OINT
TOPICAL_OINTMENT | Freq: Two times a day (BID) | CUTANEOUS | Status: AC
  Administered 2018-12-20: 16:00:00 via TOPICAL

## 2018-12-20 MED ORDER — LIDOCAINE-EPINEPHRINE (PF) 2 %-1:200000 IJ SOLN
10.0000 mL | Freq: Once | INTRAMUSCULAR | Status: AC
  Administered 2018-12-20: 10 mL via INTRADERMAL
  Filled 2018-12-20: qty 20

## 2018-12-20 NOTE — ED Triage Notes (Signed)
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

## 2018-12-20 NOTE — Discharge Instructions (Signed)
Follow up with workers comp provider in 2 days for wound check, suture removal in 3-5 days.

## 2018-12-20 NOTE — ED Provider Notes (Signed)
[facility] EMERGENC[facility] DEPARTMENT Provider Note   CSN: [id number] Arrival date & time: 12/20/18  1340    History   Chief Complaint Chief Complaint  Patient presents with  . Laceration    HPI Susan Russell is a 63 y.o. female.     63yo female presents with laceration to the left forehead.  She states she was at work and bending over when she hit her head on the commode holding a laceration to her left forehead.  Patient denies loss of consciousness, is not on blood thinners, last tetanus is less than 5 years ago.  No other injuries or concerns.  A language interpreter was used.    Past Medical History:  Diagnosis Date  . History of prediabetes    Patient had hemoglobin A1c of 6.3 in 2015 per chart  . Hyperlipidemia   . Hypertension     Patient Active Problem List   Diagnosis Date Noted  . HYPOKALEMIA 09/18/2009  . ALLERGIC RHINITIS CAUSE UNSPECIFIED 09/17/2009  . MITRAL REGURGITATION, MILD 06/11/2009  . NEPHROLITHIASIS, HX OF 05/24/2009  . DEPRESSION, MILD 03/02/2009  . GERD 03/02/2009  . CHEST PAIN UNSPECIFIED 03/02/2009  . MURMUR 12/29/2008  . HYPERTENSION 07/05/2007  . PAIN IN JOINT, HAND 05/21/2007  . ROTATOR CUFF SYNDROME, LEFT 05/21/2007    Past Surgical History:  Procedure Laterality Date  . EYE SURGERY    . OVARIAN CYST REMOVAL    . TUBAL LIGATION       OB History    Gravida  5   Para  5   Term  5   Preterm      AB      Living  5     SAB      TAB      Ectopic      Multiple      Live Births               Home Medications    Prior to Admission medications   Medication Sig Start Date End Date Taking? Authorizing Provider  amLODipine (NORVASC) 5 MG tablet Take 1 tablet (5 mg total) by mouth daily. To lower blood pressure 07/26/18   Fulp, Cammie, MD  benazepril (LOTENSIN) 40 MG tablet Take 1 tablet (40 mg total) by mouth daily for 30 days. To lower blood pressure 07/26/18 08/25/18  Fulp, Cammie, MD   cephALEXin (KEFLEX) 500 MG capsule Take 1 capsule (500 mg total) by mouth 3 (three) times daily. 07/26/18   Fulp, Cammie, MD  famotidine (PEPCID) 20 MG tablet Take 1 tablet (20 mg total) by mouth 2 (two) times daily. To reduce stomach acid 07/26/18   Fulp, Cammie, MD  naproxen (NAPROSYN) 375 MG tablet Take 1 tablet (375 mg total) by mouth 2 (two) times daily. 07/09/12   Janne Napoleon, NP    Family History History reviewed. No pertinent family history.  Social History Social History   Tobacco Use  . Smoking status: Never Smoker  . Smokeless tobacco: Never Used  Substance Use Topics  . Alcohol use: No  . Drug use: No     Allergies   Patient has no known allergies.   Review of Systems Review of Systems  Constitutional: Negative for fever.  Eyes: Negative for visual disturbance.  Musculoskeletal: Negative for neck pain and neck stiffness.  Skin: Positive for wound.  Allergic/Immunologic: Negative for immunocompromised state.  Neurological: Negative for weakness and headaches.  Hematological: Does not bruise/bleed easily.  Psychiatric/Behavioral: Negative for confusion.  All other systems reviewed and are negative.    Physical Exam Updated Vital Signs BP (!) 158/94 (BP Location: Right Arm)   Pulse 92   Temp 98 F (36.7 C) (Oral)   Resp 16   LMP 07/14/2012   SpO2 98%   Physical Exam Vitals signs and nursing note reviewed.  Constitutional:      General: She is not in acute distress.    Appearance: She is well-developed. She is not diaphoretic.  HENT:     Head: Normocephalic and atraumatic.   Eyes:     Extraocular Movements: Extraocular movements intact.     Pupils: Pupils are equal, round, and reactive to light.  Pulmonary:     Effort: Pulmonary effort is normal.  Neurological:     Mental Status: She is alert and oriented to person, place, and time.  Psychiatric:        Behavior: Behavior normal.      ED Treatments / Results  Labs (all labs ordered are  listed, but only abnormal results are displayed) Labs Reviewed - No data to display  EKG None  Radiology No results found.  Procedures .Marland Kitchen.Laceration Repair  Date/Time: 12/20/2018 3:36 PM Performed by: Jeannie FendMurphy, Dean Wonder A, PA-C Authorized by: Jeannie FendMurphy, Jahmiya Guidotti A, PA-C   Consent:    Consent obtained:  Verbal   Consent given by:  Patient   Risks discussed:  Infection, need for additional repair, pain, poor cosmetic result and poor wound healing   Alternatives discussed:  No treatment and delayed treatment Universal protocol:    Procedure explained and questions answered to patient or proxy's satisfaction: yes     Relevant documents present and verified: yes     Test results available and properly labeled: yes     Imaging studies available: yes     Required blood products, implants, devices, and special equipment available: yes     Site/side marked: yes     Immediately prior to procedure, a time out was called: yes     Patient identity confirmed:  Verbally with patient Anesthesia (see MAR for exact dosages):    Anesthesia method:  Local infiltration   Local anesthetic:  Lidocaine 2% WITH epi Laceration details:    Location:  Face   Face location:  Forehead   Length (cm):  3   Depth (mm):  5 Repair type:    Repair type:  Simple Pre-procedure details:    Preparation:  Patient was prepped and draped in usual sterile fashion Exploration:    Hemostasis achieved with:  Epinephrine   Wound exploration: wound explored through full range of motion and entire depth of wound probed and visualized     Wound extent: no foreign bodies/material noted and no muscle damage noted     Contaminated: no   Treatment:    Area cleansed with:  Saline   Amount of cleaning:  Extensive   Irrigation solution:  Sterile saline Skin repair:    Repair method:  Sutures   Suture size:  5-0   Suture material:  Nylon   Suture technique:  Simple interrupted   Number of sutures:  5 Approximation:     Approximation:  Close Post-procedure details:    Dressing:  Antibiotic ointment   Patient tolerance of procedure:  Tolerated well, no immediate complications   (including critical care time)  Medications Ordered in ED Medications  lidocaine-EPINEPHrine (XYLOCAINE W/EPI) 2 %-1:200000 (PF) injection 10 mL (has no administration in time range)  bacitracin ointment (has no administration in time  range)     Initial Impression / Assessment and Plan / ED Course  I have reviewed the triage vital signs and the nursing notes.  Pertinent labs & imaging results that were available during my care of the patient were reviewed by me and considered in my medical decision making (see chart for details).  Clinical Course as of Dec 19 1536  Mon Dec 20, 2018  [id number] 63 year old female presents with laceration to the left forehead.  Patient was at work when she struck her head on the toilet, no loss of consciousness, bleeding is controlled, not on blood thinners.  On exam patient has a 3 cm curvilinear laceration to the left forehead.  Wound was closed without difficulty.  Recommend suture removal in 3 to 5 days with [employer].  Interpreter was used for history, physical, treatment planning.   [LM]    Clinical Course User Index [LM] Jeannie FendMurphy, Rhyse Loux A, PA-C      Final Clinical Impressions(s) / ED Diagnoses   Final diagnoses:  Facial laceration, initial encounter    ED Discharge Orders    None       Jeannie FendMurphy, Aleighya Mcanelly A, PA-C 12/20/18 1538    Pricilla LovelessGoldston, Scott, MD 12/21/18 [phone]

## 2018-12-20 NOTE — ED Notes (Signed)
Spoke with pt employer multiple times. Prior to d/c informed him that the PA said the pt would need to have a drug screen at a clinic that performs chain of custody for the purposes of worker's comp. Our drug screen in the ED is not witnessed and documented with a chain of custody.

## 2018-12-20 NOTE — ED Notes (Signed)
Patient verbalizes understanding of discharge instructions. Opportunity for questioning and answers were provided. Armband removed by staff, pt discharged from ED.  

## 2019-01-06 ENCOUNTER — Ambulatory Visit: Payer: Self-pay | Admitting: Family Medicine

## 2019-01-06 ENCOUNTER — Other Ambulatory Visit: Payer: Self-pay

## 2019-01-06 ENCOUNTER — Ambulatory Visit: Payer: Self-pay | Attending: Family Medicine | Admitting: Physician Assistant

## 2019-01-06 DIAGNOSIS — I10 Essential (primary) hypertension: Secondary | ICD-10-CM

## 2019-01-06 MED ORDER — BENAZEPRIL HCL 40 MG PO TABS: 40.0000 mg | ORAL_TABLET | Freq: Every day | ORAL | 1 refills | Status: DC

## 2019-01-06 MED ORDER — AMLODIPINE BESYLATE 5 MG PO TABS: 5.0000 mg | ORAL_TABLET | Freq: Every day | ORAL | 1 refills | Status: DC

## 2019-01-06 NOTE — Progress Notes (Signed)
Patient ID: Susan Russell, female   DOB: February 05, 1956, 63 y.o.   MRN: [id number]   Susan Russell, is a 63 y.o. female  PJA:[id number]  HAL:[id number]  DOB - Dec 22, 1955  Subjective:  Chief Complaint and HPI: Susan Russell is a 63 y.o. female here today for BP issues.  Almost out of meds.  BP at home is 120-130/70-80.  No concerns or complaints.  No HA/CP/dizziness.    ROS:   Constitutional:  No f/c, No night sweats, No unexplained weight loss. EENT:  No vision changes, No blurry vision, No hearing changes. No mouth, throat, or ear problems.  Respiratory: No cough, No SOB Cardiac: No CP, no palpitations GI:  No abd pain, No N/V/D. GU: No Urinary s/sx Musculoskeletal: No joint pain Neuro: No headache, no dizziness, no motor weakness.  Skin: No rash Endocrine:  No polydipsia. No polyuria.  Psych: Denies SI/HI  No problems updated.  ALLERGIES: No Known Allergies  PAST MEDICAL HISTORY: Past Medical History:  Diagnosis Date   History of prediabetes    Patient had hemoglobin A1c of 6.3 in 2015 per chart   Hyperlipidemia    Hypertension     MEDICATIONS AT HOME: Prior to Admission medications   Medication Sig Start Date End Date Taking? Authorizing Provider  amLODipine (NORVASC) 5 MG tablet Take 1 tablet (5 mg total) by mouth daily. To lower blood pressure 01/06/19  Yes Lucendia Leard M, PA-C  benazepril (LOTENSIN) 40 MG tablet Take 1 tablet (40 mg total) by mouth daily. To lower blood pressure 01/06/19 02/05/19 Yes Elliannah Wayment M, PA-C  cephALEXin (KEFLEX) 500 MG capsule Take 1 capsule (500 mg total) by mouth 3 (three) times daily. Patient not taking: Reported on 01/06/2019 07/26/18   Fulp, Ander Gaster, MD  famotidine (PEPCID) 20 MG tablet Take 1 tablet (20 mg total) by mouth 2 (two) times daily. To reduce stomach acid Patient not taking: Reported on 01/06/2019 07/26/18   Fulp, Ander Gaster, MD  naproxen (NAPROSYN) 375 MG tablet Take 1 tablet (375 mg total) by mouth 2  (two) times daily. Patient not taking: Reported on 01/06/2019 07/09/12   Janne Napoleon, NP     Objective:  EXAM:   Vitals:   01/06/19 0904  BP: (!) 159/84  Pulse: 71  Temp: 98.4 F (36.9 C)  TempSrc: Oral  SpO2: 97%  Weight: 139 lb (63 kg)  Height: 4\' 11"  (1.499 m)    General appearance : A&OX3. NAD. Non-toxic-appearing HEENT: Atraumatic and Normocephalic.  PERRLA. EOM intact.  Neck: supple, no JVD. No cervical lymphadenopathy. No thyromegaly Chest/Lungs:  Breathing-non-labored, Good air entry bilaterally, breath sounds normal without rales, rhonchi, or wheezing  CVS: S1 S2 regular, no murmurs, gallops, rubs  Neurology:  CN II-XII grossly intact, Non focal.   Psych:  TP linear. J/I WNL. Normal speech. Appropriate eye contact and affect.  Skin:  No Rash  Data Review Lab Results  Component Value Date   HGBA1C 5.8 (A) 07/26/2018   HGBA1C 6.3 (H) 01/27/2014     Assessment & Plan   1. Essential hypertension Controlled based on home readings.  Continue current regimen - benazepril (LOTENSIN) 40 MG tablet; Take 1 tablet (40 mg total) by mouth daily. To lower blood pressure  Dispense: 90 tablet; Refill: 1 - amLODipine (NORVASC) 5 MG tablet; Take 1 tablet (5 mg total) by mouth daily. To lower blood pressure  Dispense: 90 tablet; Refill: 1 - Comprehensive metabolic panel - CBC with Differential/Platelet; Future - Lipid panel   Patient have been counseled  extensively about nutrition and exercise  Return in about 6 months (around 07/09/2019) for chronic conditions with PCP.  The patient was given clear instructions to go to ER or return to medical center if symptoms don't improve, worsen or new problems develop. The patient verbalized understanding. The patient was told to call to get lab results if they haven't heard anything in the next week.     Georgian CoAngela Marilene Vath, PA-C [facility] [place], [place] [phone]   01/06/2019, 9:23 AM

## 2019-01-07 LAB — CBC WITH DIFFERENTIAL/PLATELET
Basophils Absolute: 0 10*3/uL (ref 0.0–0.2)
Basos: 1 %
EOS (ABSOLUTE): 0.2 10*3/uL (ref 0.0–0.4)
Eos: 3 %
Hematocrit: 39.5 % (ref 34.0–46.6)
Hemoglobin: 12.6 g/dL (ref 11.1–15.9)
Immature Grans (Abs): 0 10*3/uL (ref 0.0–0.1)
Immature Granulocytes: 0 %
Lymphocytes Absolute: 1.7 10*3/uL (ref 0.7–3.1)
Lymphs: 31 %
MCH: 29 pg (ref 26.6–33.0)
MCHC: 31.9 g/dL (ref 31.5–35.7)
MCV: 91 fL (ref 79–97)
Monocytes Absolute: 0.3 10*3/uL (ref 0.1–0.9)
Monocytes: 6 %
Neutrophils Absolute: 3.2 10*3/uL (ref 1.4–7.0)
Neutrophils: 59 %
Platelets: 225 10*3/uL (ref 150–450)
RBC: 4.35 x10E6/uL (ref 3.77–5.28)
RDW: 13.2 % (ref 11.7–15.4)
WBC: 5.3 10*3/uL (ref 3.4–10.8)

## 2019-01-07 LAB — COMPREHENSIVE METABOLIC PANEL
ALT: 13 IU/L (ref 0–32)
AST: 15 IU/L (ref 0–40)
Albumin/Globulin Ratio: 1.1 — ABNORMAL LOW (ref 1.2–2.2)
Albumin: 3.9 g/dL (ref 3.8–4.8)
Alkaline Phosphatase: 63 IU/L (ref 39–117)
BUN/Creatinine Ratio: 31 — ABNORMAL HIGH (ref 12–28)
BUN: 16 mg/dL (ref 8–27)
Bilirubin Total: 0.5 mg/dL (ref 0.0–1.2)
CO2: 23 mmol/L (ref 20–29)
Calcium: 9.5 mg/dL (ref 8.7–10.3)
Chloride: 104 mmol/L (ref 96–106)
Creatinine, Ser: 0.52 mg/dL — ABNORMAL LOW (ref 0.57–1.00)
GFR calc Af Amer: 119 mL/min/{1.73_m2} (ref >59)
GFR calc non Af Amer: 103 mL/min/{1.73_m2} (ref >59)
Globulin, Total: 3.5 g/dL (ref 1.5–4.5)
Glucose: 95 mg/dL (ref 65–99)
Potassium: 4.2 mmol/L (ref 3.5–5.2)
Sodium: 141 mmol/L (ref 134–144)
Total Protein: 7.4 g/dL (ref 6.0–8.5)

## 2019-01-07 LAB — LIPID PANEL
Chol/HDL Ratio: 3.6 ratio (ref 0.0–4.4)
Cholesterol, Total: 181 mg/dL (ref 100–199)
HDL: 50 mg/dL (ref >39)
LDL Calculated: 111 mg/dL — ABNORMAL HIGH (ref 0–99)
Triglycerides: 99 mg/dL (ref 0–149)
VLDL Cholesterol Cal: 20 mg/dL (ref 5–40)

## 2019-01-11 ENCOUNTER — Other Ambulatory Visit: Payer: Self-pay | Admitting: *Deleted

## 2019-01-11 DIAGNOSIS — R1013 Epigastric pain: Secondary | ICD-10-CM

## 2019-01-11 MED ORDER — FAMOTIDINE 20 MG PO TABS: 20.0000 mg | ORAL_TABLET | Freq: Two times a day (BID) | ORAL | 0 refills | Status: DC

## 2019-01-11 MED ORDER — NAPROXEN 375 MG PO TABS: 375.0000 mg | ORAL_TABLET | Freq: Two times a day (BID) | ORAL | 0 refills | Status: DC

## 2020-02-01 IMAGING — US ULTRASOUND ABDOMEN LIMITED
1 series · 14 of 25 positions shown · non-contrast
Comparison: None.

CLINICAL DATA: Right upper quadrant pain

EXAM:
ULTRASOUND ABDOMEN LIMITED RIGHT UPPER QUADRANT

[Series 1: ultrasound abdomen limited · 14 of 37 slices shown]
[im 1/37]
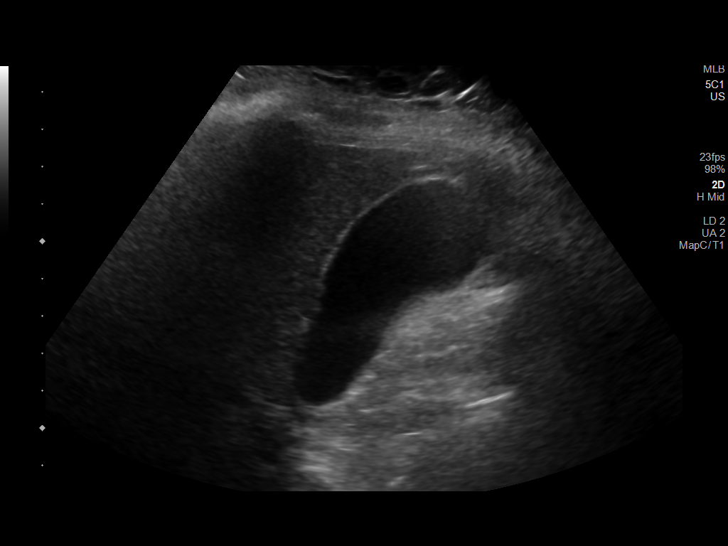
[im 4/37]
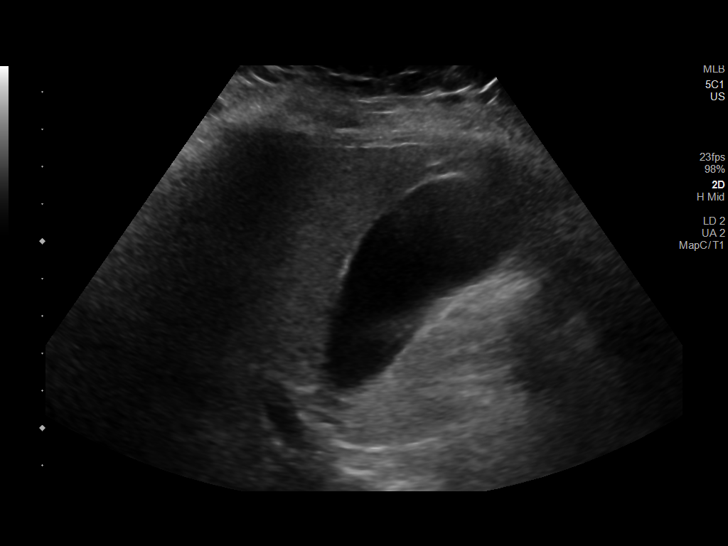
[im 7/37]
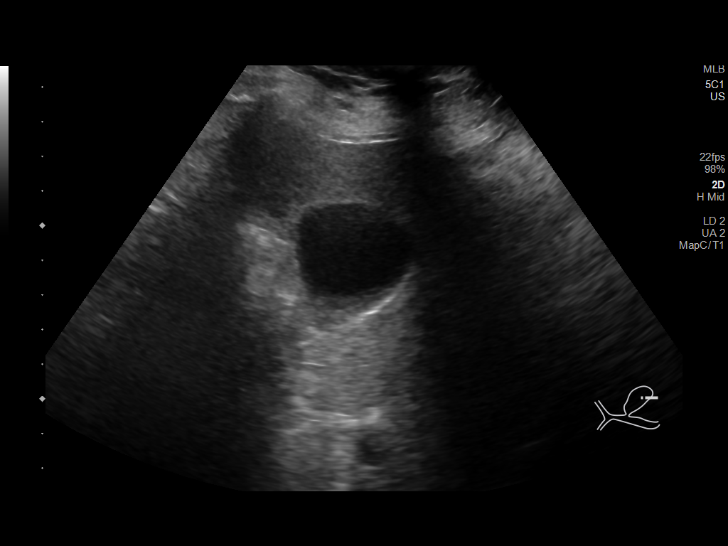
[im 10/37]
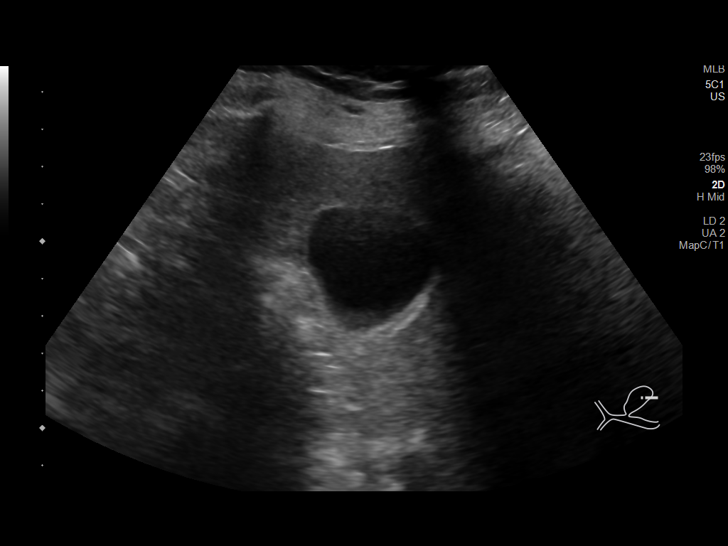
[im 13/37]
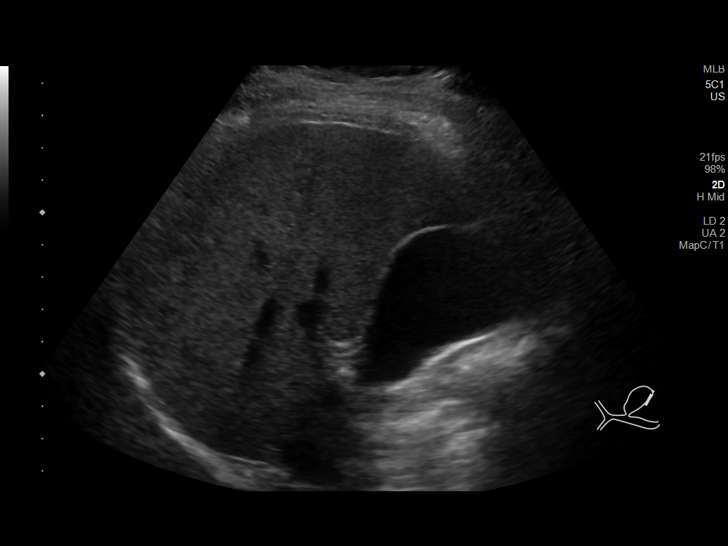
[im 14/37]
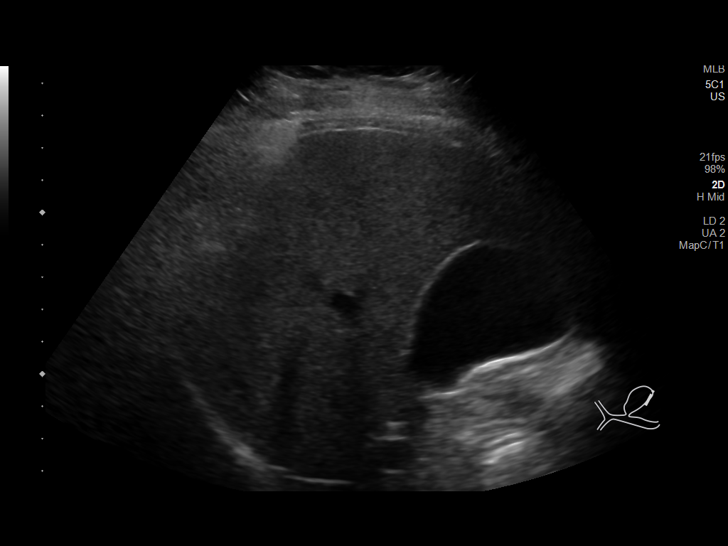
[im 17/37]
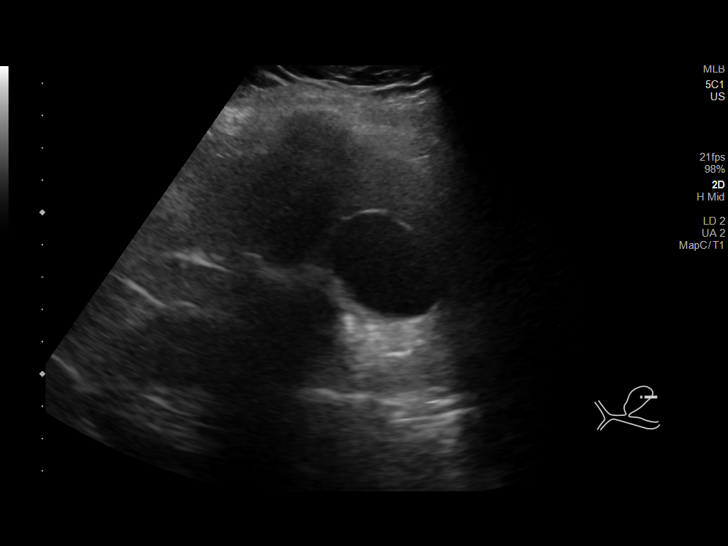
[im 20/37]
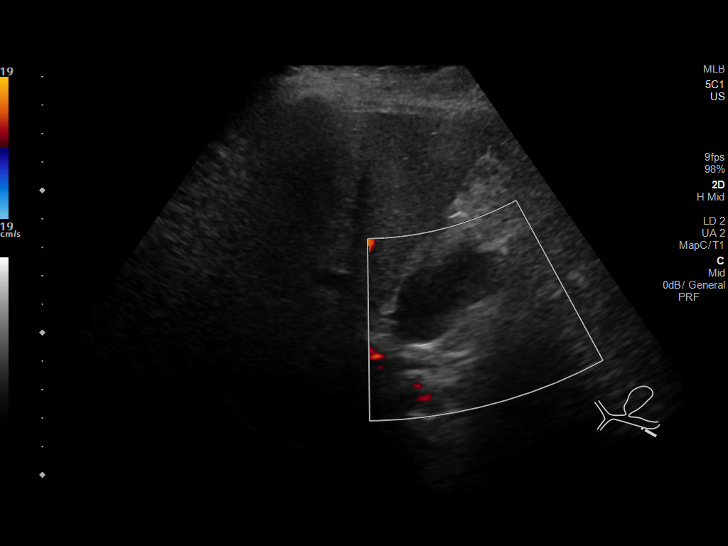
[im 23/37]
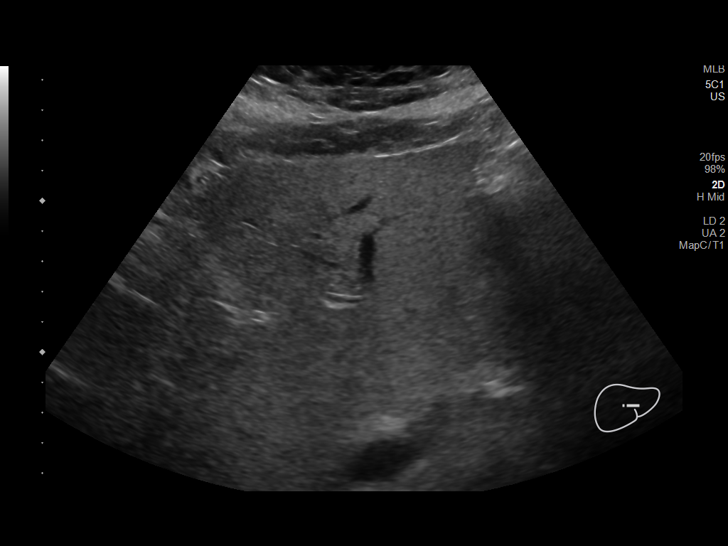
[im 25/37]
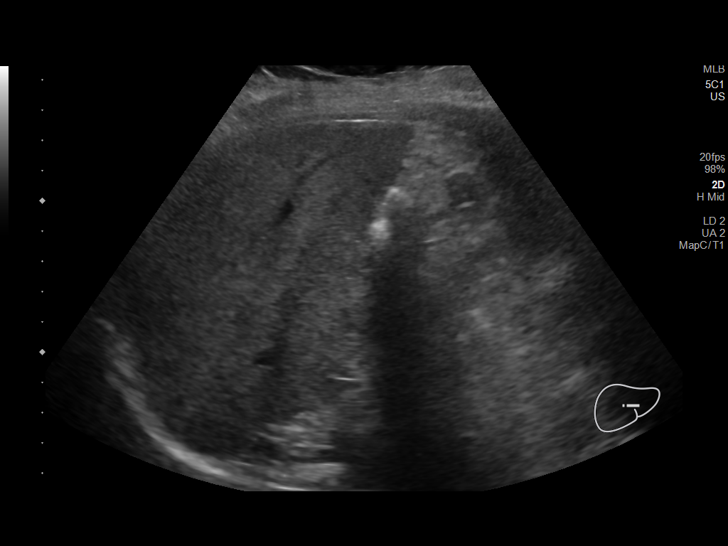
[im 28/37]
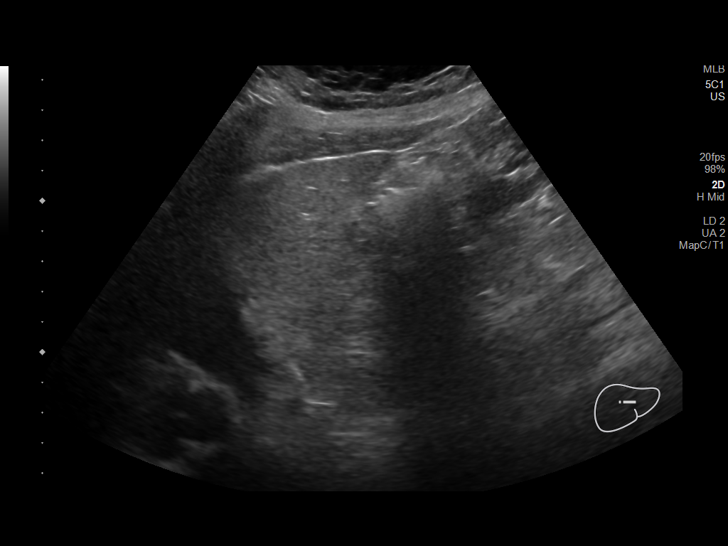
[im 31/37]
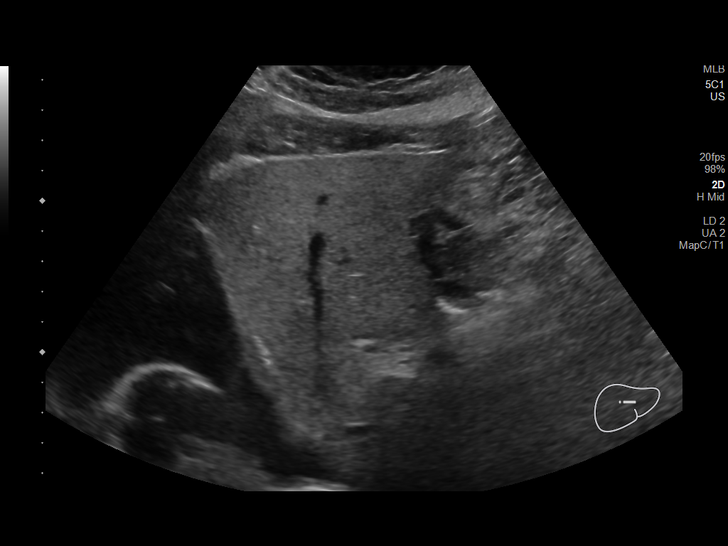
[im 34/37]
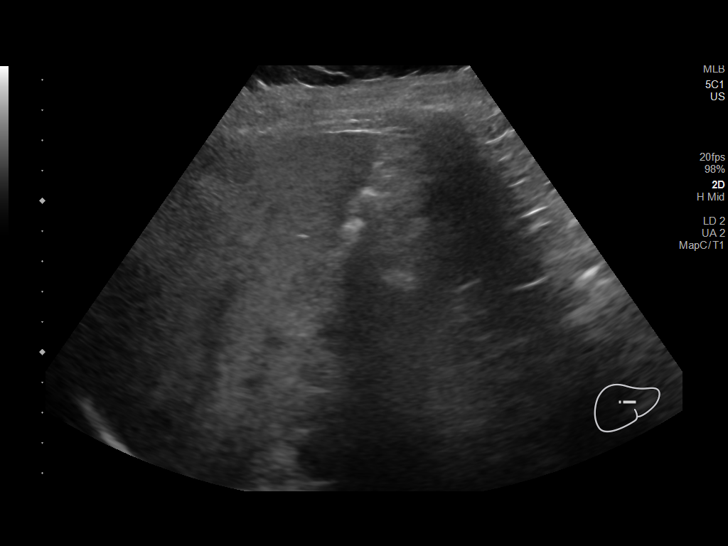
[im 37/37]
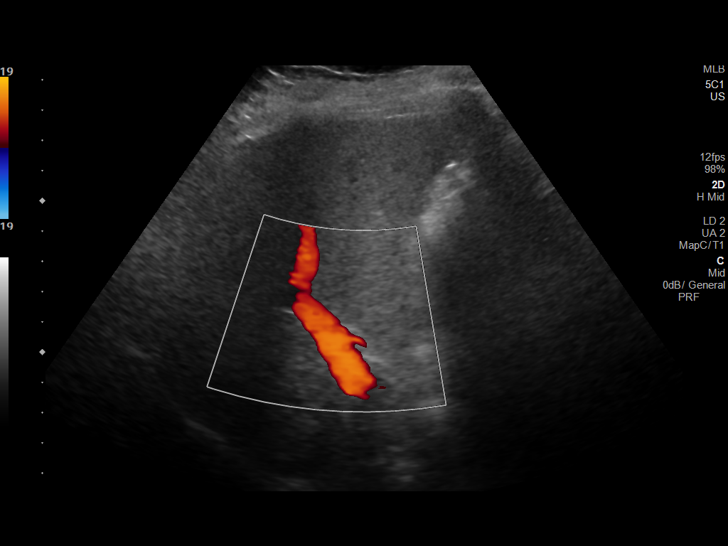

[14 of 25 positions shown; findings below may reference images not displayed]

FINDINGS: Gallbladder:

No gallstones or wall thickening visualized. There is no
pericholecystic fluid. No sonographic Murphy sign noted by
sonographer.

Common bile duct:

Diameter: 4 mm. No intrahepatic or extrahepatic biliary duct
dilatation.

Liver:

No focal lesion identified. Liver echogenicity overall appears
increased. Portal vein is patent on color Doppler imaging with
normal direction of blood flow towards the liver.
IMPRESSION: Increase in liver echogenicity likely represents a degree of hepatic
steatosis. No focal liver lesions are evident on this study. Study
otherwise unremarkable.

## 2020-09-03 ENCOUNTER — Ambulatory Visit: Payer: Self-pay | Attending: Nurse Practitioner | Admitting: Nurse Practitioner

## 2020-09-03 ENCOUNTER — Encounter: Payer: Self-pay | Admitting: Nurse Practitioner

## 2020-09-03 ENCOUNTER — Other Ambulatory Visit: Payer: Self-pay

## 2020-09-03 DIAGNOSIS — R1013 Epigastric pain: Secondary | ICD-10-CM

## 2020-09-03 DIAGNOSIS — Z1231 Encounter for screening mammogram for malignant neoplasm of breast: Secondary | ICD-10-CM

## 2020-09-03 DIAGNOSIS — I10 Essential (primary) hypertension: Secondary | ICD-10-CM

## 2020-09-03 DIAGNOSIS — S83241D Other tear of medial meniscus, current injury, right knee, subsequent encounter: Secondary | ICD-10-CM

## 2020-09-03 MED ORDER — FAMOTIDINE 20 MG PO TABS: 20.0000 mg | ORAL_TABLET | Freq: Two times a day (BID) | ORAL | 0 refills | Status: AC

## 2020-09-03 MED ORDER — BENAZEPRIL HCL 40 MG PO TABS: 40.0000 mg | ORAL_TABLET | Freq: Every day | ORAL | 0 refills | Status: DC

## 2020-09-03 MED ORDER — NAPROXEN 375 MG PO TABS: 375.0000 mg | ORAL_TABLET | Freq: Two times a day (BID) | ORAL | 0 refills | Status: AC

## 2020-09-03 MED ORDER — AMLODIPINE BESYLATE 5 MG PO TABS: 5.0000 mg | ORAL_TABLET | Freq: Every day | ORAL | 0 refills | Status: DC

## 2020-09-03 NOTE — Progress Notes (Signed)
Virtual Visit via Telephone Note Due to national recommendations of social distancing due to COVID 19, telehealth visit is felt to be most appropriate for this patient at this time.  I discussed the limitations, risks, security and privacy concerns of performing an evaluation and management service by telephone and the availability of in person appointments. I also discussed with the patient that there may be a patient responsible charge related to this service. The patient expressed understanding and agreed to proceed.    I connected with Monico Hoar on 09/03/20  at   4:10 PM EDT  EDT by telephone and verified that I am speaking with the correct person using two identifiers.   Consent I discussed the limitations, risks, security and privacy concerns of performing an evaluation and management service by telephone and the availability of in person appointments. I also discussed with the patient that there may be a patient responsible charge related to this service. The patient expressed understanding and agreed to proceed.   Location of Patient: Private Residence    Location of Provider: [facility]    Persons participating in Telemedicine visit: Bertram Denver FNP-BC YY [place] CMA Jerzie Transue  Spanish Interpreter [place] [id number] Patient's son was also on the call   History of Present Illness: Telemedicine visit for: Right Knee pain She has a past medical history of History of prediabetes, Hyperlipidemia, and Hypertension.   Knee Pain She presents with complaint of right knee pain. Onset of the symptoms was a few months ago. Inciting event: none known. Current symptoms include crepitus sensation, giving out, popping sensation and swelling. Pain is aggravated by any weight bearing, going up and down stairs, standing and walking.   Evaluation to date: plain films: abnormal right meniscal injury. Treatment to date: avoidance of offending activity,  brace which is not very effective and elastic supporter which is not very effective  [facility] to severe right knee pain. She had imaging performed and was told there was a meniscal tear and she needed to follow up with the PCP in order to be referred to ortho. She states she has the films and paperwork from the urgent care visit.   Essential Hypertension Blood pressure is not well controlled. She has only been taking amlodipine 5 mg and was not aware she was also supposed to continue taking benazepril 40 mg daily. Denies chest pain, shortness of breath, palpitations, lightheadedness, dizziness, headaches or BLE edema.  BP Readings from Last 3 Encounters:  01/06/19 (!) 159/84  12/20/18 (!) 173/88  07/26/18 138/79   tykenol  Past Medical History:  Diagnosis Date  . History of prediabetes    Patient had hemoglobin A1c of 6.3 in 2015 per chart  . Hyperlipidemia   . Hypertension     Past Surgical History:  Procedure Laterality Date  . EYE SURGERY    . OVARIAN CYST REMOVAL    . TUBAL LIGATION      History reviewed. No pertinent family history.  Social History   Socioeconomic History  . Marital status: Divorced    Spouse name: Not on file  . Number of children: Not on file  . Years of education: Not on file  . Highest education level: Not on file  Occupational History  . Not on file  Tobacco Use  . Smoking status: Never Smoker  . Smokeless tobacco: Never Used  Substance and Sexual Activity  . Alcohol use: No  . Drug  use: No  . Sexual activity: Yes    Birth control/protection: Surgical  Other Topics Concern  . Not on file  Social History Narrative  . Not on file   Social Determinants of Health   Financial Resource Strain: Not on file  Food Insecurity: Not on file  Transportation Needs: Not on file  Physical Activity: Not on file  Stress: Not on file  Social Connections: Not on file     Observations/Objective: Awake, alert  and oriented x 3   Review of Systems  Constitutional: Negative for fever, malaise/fatigue and weight loss.  HENT: Negative.  Negative for nosebleeds.   Eyes: Negative.  Negative for blurred vision, double vision and photophobia.  Respiratory: Negative.  Negative for cough and shortness of breath.   Cardiovascular: Negative.  Negative for chest pain, palpitations and leg swelling.  Gastrointestinal: Positive for heartburn. Negative for nausea and vomiting.  Musculoskeletal: Positive for joint pain. Negative for myalgias.  Neurological: Negative.  Negative for dizziness, focal weakness, seizures and headaches.  Psychiatric/Behavioral: Negative.  Negative for suicidal ideas.    Assessment and Plan: Susan Russell was seen today for establish care.  Diagnoses and all orders for this visit:  Tear of medial meniscus of right knee, current, unspecified tear type, subsequent encounter -     Ambulatory referral to Orthopedic Surgery -     naproxen (NAPROSYN) 375 MG tablet; Take 1 tablet (375 mg total) by mouth 2 (two) times daily. May alternate with heat and ice application for pain relief. May also alternate with acetaminophen  as prescribed pain relief. Other alternatives include massage, acupuncture and water aerobics.  You must stay active and avoid a sedentary lifestyle.  Essential hypertension -     amLODipine (NORVASC) 5 MG tablet; Take 1 tablet (5 mg total) by mouth daily. To lower blood pressure -     benazepril (LOTENSIN) 40 MG tablet; Take 1 tablet (40 mg total) by mouth daily. To lower blood pressure Continue all antihypertensives as prescribed.  Remember to bring in your blood pressure log with you for your follow up appointment.  DASH/Mediterranean Diets are healthier choices for HTN.    Dyspepsia -     famotidine (PEPCID) 20 MG tablet; Take 1 tablet (20 mg total) by mouth 2 (two) times daily. To reduce stomach acid INSTRUCTIONS: Avoid GERD Triggers: acidic, spicy or fried foods,  caffeine, coffee, sodas,  alcohol and chocolate.   Breast cancer screening by mammogram -     MM DIGITAL SCREENING BILATERAL; Future     Follow Up Instructions Return for PAP SMEAR.     I discussed the assessment and treatment plan with the patient. The patient was provided an opportunity to ask questions and all were answered. The patient agreed with the plan and demonstrated an understanding of the instructions.   The patient was advised to call back or seek an in-person evaluation if the symptoms worsen or if the condition fails to improve as anticipated.  I provided 15 minutes of non-face-to-face time during this encounter including median intraservice time, reviewing previous notes, labs, imaging, medications and explaining diagnosis and management.  Claiborne Rigg, FNP-BC

## 2020-09-03 NOTE — Progress Notes (Signed)
Refills on meds, pt stated that she has pain in right knee

## 2020-09-11 ENCOUNTER — Ambulatory Visit (INDEPENDENT_AMBULATORY_CARE_PROVIDER_SITE_OTHER): Payer: Self-pay

## 2020-09-11 ENCOUNTER — Encounter: Payer: Self-pay | Admitting: Orthopaedic Surgery

## 2020-09-11 ENCOUNTER — Other Ambulatory Visit: Payer: Self-pay

## 2020-09-11 ENCOUNTER — Ambulatory Visit (INDEPENDENT_AMBULATORY_CARE_PROVIDER_SITE_OTHER): Payer: Self-pay | Admitting: Orthopaedic Surgery

## 2020-09-11 VITALS — Ht 59.5 in | Wt 146.2 lb

## 2020-09-11 DIAGNOSIS — M25561 Pain in right knee: Secondary | ICD-10-CM

## 2020-09-11 DIAGNOSIS — G8929 Other chronic pain: Secondary | ICD-10-CM

## 2020-09-11 MED ORDER — BUPIVACAINE HCL 0.25 % IJ SOLN
2.0000 mL | INTRAMUSCULAR | Status: AC | PRN
  Administered 2020-09-11: 2 mL via INTRA_ARTICULAR

## 2020-09-11 MED ORDER — LIDOCAINE HCL 1 % IJ SOLN
2.0000 mL | INTRAMUSCULAR | Status: AC | PRN
  Administered 2020-09-11: 2 mL

## 2020-09-11 MED ORDER — METHYLPREDNISOLONE ACETATE 40 MG/ML IJ SUSP
40.0000 mg | INTRAMUSCULAR | Status: AC | PRN
  Administered 2020-09-11: 40 mg via INTRA_ARTICULAR

## 2020-09-11 NOTE — Progress Notes (Signed)
Office Visit Note   Patient: Susan Russell           Date of Birth: 07/20/1955           MRN: [id number] Visit Date: 09/11/2020              Requested by: Claiborne Rigg, NP [address] [place],  [place] [address] PCP: Claiborne Rigg, NP   Assessment & Plan: Visit Diagnoses:  1. Chronic pain of right knee     Plan: Impression is right knee questionable medial meniscus tear.  We have discussed aspiration and injection for which she would like to proceed.  If her symptoms or not any better she will follow-up with [place].  Call with concerns or questions in the meantime.  Follow-Up Instructions: Return if symptoms worsen or fail to improve.   Orders:  Orders Placed This Encounter  Procedures  . Large Joint Inj: R knee  . XR KNEE 3 VIEW RIGHT   No orders of the defined types were placed in this encounter.     Procedures: Large Joint Inj: R knee on 09/11/2020 9:51 AM Indications: pain Details: 22 G needle, anterolateral approach Medications: 2 mL lidocaine 1 %; 2 mL bupivacaine 0.25 %; 40 mg methylPREDNISolone acetate 40 MG/ML      Clinical Data: No additional findings.   Subjective: Chief Complaint  Patient presents with  . Right Knee - Pain    HPI patient is a pleasant 65 year old female who comes in today with right knee pain.  This began after sustaining a fall around Thanksgiving.  Her pain is improved since.  All of the pain is to the medial aspect.  She notes occasional popping and swelling.  She has increased pain with certain movements as well as when she is walking a lot.  She has been using ice and a knee sleeve without significant relief.  She has been taking naproxen which does seem to ease her symptoms.  No previous cortisone injection to the right knee.  Review of Systems as detailed in HPI.  All others reviewed and are negative.   Objective: Vital Signs: Ht 4' 11.5" (1.511 m)   Wt 146 lb 3.2 oz (66.3 kg)   LMP 07/14/2012   BMI 29.03  kg/m   Physical Exam well-developed well-nourished female no acute distress.  Alert and oriented x3.  Ortho Exam right knee exam shows a small to moderate effusion.  Range of motion 0 to 100 degrees.  Medial joint line tenderness.  Mild tenderness lateral joint line.  Stable valgus varus stress.  She is neurovascular intact distally.  Specialty Comments:  No specialty comments available.  Imaging: XR KNEE 3 VIEW RIGHT  Result Date: 09/11/2020 Mild to moderate degenerative changes in all 3 compartments    PMFS History: Patient Active Problem List   Diagnosis Date Noted  . HYPOKALEMIA 09/18/2009  . ALLERGIC RHINITIS CAUSE UNSPECIFIED 09/17/2009  . MITRAL REGURGITATION, MILD 06/11/2009  . NEPHROLITHIASIS, HX OF 05/24/2009  . DEPRESSION, MILD 03/02/2009  . GERD 03/02/2009  . CHEST PAIN UNSPECIFIED 03/02/2009  . MURMUR 12/29/2008  . HYPERTENSION 07/05/2007  . PAIN IN JOINT, HAND 05/21/2007  . ROTATOR CUFF SYNDROME, LEFT 05/21/2007   Past Medical History:  Diagnosis Date  . History of prediabetes    Patient had hemoglobin A1c of 6.3 in 2015 per chart  . Hyperlipidemia   . Hypertension     History reviewed. No pertinent family history.  Past Surgical History:  Procedure Laterality  Date  . EYE SURGERY    . OVARIAN CYST REMOVAL    . TUBAL LIGATION     Social History   Occupational History  . Not on file  Tobacco Use  . Smoking status: Never Smoker  . Smokeless tobacco: Never Used  Substance and Sexual Activity  . Alcohol use: No  . Drug use: No  . Sexual activity: Yes    Birth control/protection: Surgical

## 2020-10-26 ENCOUNTER — Other Ambulatory Visit: Payer: Self-pay

## 2020-10-26 ENCOUNTER — Encounter: Payer: Self-pay | Admitting: Registered Nurse

## 2020-10-26 ENCOUNTER — Ambulatory Visit (INDEPENDENT_AMBULATORY_CARE_PROVIDER_SITE_OTHER): Payer: Self-pay | Admitting: Registered Nurse

## 2020-10-26 VITALS — BP 128/76 | HR 74 | Temp 98.3°F | Ht 59.5 in | Wt 144.4 lb

## 2020-10-26 DIAGNOSIS — M25561 Pain in right knee: Secondary | ICD-10-CM

## 2020-10-26 DIAGNOSIS — R1114 Bilious vomiting: Secondary | ICD-10-CM

## 2020-10-26 DIAGNOSIS — Z7689 Persons encountering health services in other specified circumstances: Secondary | ICD-10-CM

## 2020-10-26 DIAGNOSIS — Z13228 Encounter for screening for other metabolic disorders: Secondary | ICD-10-CM

## 2020-10-26 DIAGNOSIS — G8929 Other chronic pain: Secondary | ICD-10-CM

## 2020-10-26 DIAGNOSIS — Z130 Encounter for screening for diseases of the blood and blood-forming organs and certain disorders involving the immune mechanism: Secondary | ICD-10-CM

## 2020-10-26 DIAGNOSIS — Z13220 Encounter for screening for lipoid disorders: Secondary | ICD-10-CM

## 2020-10-26 DIAGNOSIS — Z1329 Encounter for screening for other suspected endocrine disorder: Secondary | ICD-10-CM

## 2020-10-26 LAB — LIPID PANEL
Cholesterol: 192 mg/dL (ref 0–200)
HDL: 47.4 mg/dL (ref >39.00)
LDL Cholesterol: 119 mg/dL — ABNORMAL HIGH (ref 0–99)
NonHDL: 144.93
Total CHOL/HDL Ratio: 4
Triglycerides: 129 mg/dL (ref 0.0–149.0)
VLDL: 25.8 mg/dL (ref 0.0–40.0)

## 2020-10-26 LAB — CBC
HCT: 39.2 % (ref 36.0–46.0)
Hemoglobin: 13.4 g/dL (ref 12.0–15.0)
MCHC: 34.2 g/dL (ref 30.0–36.0)
MCV: 87.8 fl (ref 78.0–100.0)
Platelets: 193 K/uL (ref 150.0–400.0)
RBC: 4.47 Mil/uL (ref 3.87–5.11)
RDW: 14.4 % (ref 11.5–15.5)
WBC: 5.9 K/uL (ref 4.0–10.5)

## 2020-10-26 LAB — BASIC METABOLIC PANEL WITH GFR
BUN: 16 mg/dL (ref 6–23)
CO2: 27 meq/L (ref 19–32)
Calcium: 9.5 mg/dL (ref 8.4–10.5)
Chloride: 104 meq/L (ref 96–112)
Creatinine, Ser: 0.53 mg/dL (ref 0.40–1.20)
GFR: 98.2 mL/min
Glucose, Bld: 93 mg/dL (ref 70–99)
Potassium: 4.2 meq/L (ref 3.5–5.1)
Sodium: 140 meq/L (ref 135–145)

## 2020-10-26 LAB — HEMOGLOBIN A1C: Hgb A1c MFr Bld: 6.2 % (ref 4.6–6.5)

## 2020-10-26 MED ORDER — ONDANSETRON HCL 4 MG PO TABS: 4.0000 mg | ORAL_TABLET | Freq: Three times a day (TID) | ORAL | 0 refills | Status: AC | PRN

## 2020-10-26 MED ORDER — MELOXICAM 7.5 MG PO TABS: 7.5000 mg | ORAL_TABLET | Freq: Every day | ORAL | 0 refills | Status: AC

## 2020-10-26 NOTE — Patient Instructions (Signed)
Susan Russell -  Mucho gusto  Lamento tu dolor de rodilla, el dolor crnico nunca es divertido. El meloxicam puede ayudar. Est bien tomar una vez al da segn sea necesario. Puede complementar esto con tylenol, pero evite naproxeno, ibuprofeno, aleve, etc. cuando tome meloxicam.  Las nuseas y los vmitos suenan como un sndrome de vmitos cclicos. Tomar ondansetrn al inicio de los sntomas puede ayudar a Film/video editor la gravedad y Musician. Consulte a continuacin los desencadenantes comunes.  Estar en contacto con los resultados del laboratorio. Me gustara verlo al menos una vez al ao para una visita de [place], pero venga antes si surge alguna inquietud nueva.  Salley Slaughter

## 2020-10-26 NOTE — Progress Notes (Signed)
New Patient Office Visit  Subjective:  Patient ID: Susan Russell, female    DOB: 03-Oct-1955  Age: 65 y.o. MRN: [id number]  CC:  Chief Complaint  Patient presents with   Knee Pain    Swelling right knee, pain with bending    HPI Susan Russell presents for visit to est care.  Knee pain R knee, has suspected meniscus tear Has seen Dr. Roda Shutters - recently had cortisone shot, unfortunately found that has worn off after about 3 weeks. Aspiration as well at that time. Interested in further treatment options  Blurry vision Ongoing since fall in November 2021 [facility] face down - hit nose, but otherwise minimal trauma to head No headaches, lightheadedness, dizziness Trouble is mostly with focusing up close  NVD Onset a few years ago Every 6 mo or so has another episode Increased to every 3 mo or so in past 1-2 years Last episode in March - nausea and diarrhea Episodes last usually 3-6 hours   Histories reviewed and updated with patient.    Past Medical History:  Diagnosis Date   Glaucoma    History of prediabetes    Patient had hemoglobin A1c of 6.3 in 2015 per chart   Hyperlipidemia    Hypertension     Past Surgical History:  Procedure Laterality Date   EYE SURGERY     OVARIAN CYST REMOVAL     TUBAL LIGATION      History reviewed. No pertinent family history.  Social History   Socioeconomic History   Marital status: Single    Spouse name: Not on file   Number of children: Not on file   Years of education: Not on file   Highest education level: Not on file  Occupational History   Not on file  Tobacco Use   Smoking status: Never   Smokeless tobacco: Never  Substance and Sexual Activity   Alcohol use: No   Drug use: No   Sexual activity: Yes    Birth control/protection: Surgical  Other Topics Concern   Not on file  Social History Narrative   Not on file   Social Determinants of Health   Financial Resource Strain: Not on file  Food  Insecurity: Not on file  Transportation Needs: Not on file  Physical Activity: Not on file  Stress: Not on file  Social Connections: Not on file  Intimate Partner Violence: Not on file    ROS Review of Systems  Constitutional: Negative.   HENT: Negative.    Eyes: Negative.   Respiratory: Negative.    Cardiovascular: Negative.   Gastrointestinal: Negative.   Genitourinary: Negative.   Musculoskeletal: Negative.   Skin: Negative.   Neurological: Negative.   Psychiatric/Behavioral: Negative.    All other systems reviewed and are negative.  Objective:   Today's Vitals: BP 128/76   Pulse 74   Temp 98.3 F (36.8 C)   Ht 4' 11.5" (1.511 m)   Wt 144 lb 6.1 oz (65.5 kg)   LMP 07/14/2012   SpO2 98%   BMI 28.67 kg/m   Physical Exam Vitals and nursing note reviewed.  Constitutional:      General: She is not in acute distress.    Appearance: Normal appearance. She is not ill-appearing, toxic-appearing or diaphoretic.  Cardiovascular:     Rate and Rhythm: Normal rate and regular rhythm.     Pulses: Normal pulses.     Heart sounds: Normal heart sounds. No murmur heard.   No friction rub. No gallop.  Pulmonary:     Effort: Pulmonary effort is normal. No respiratory distress.     Breath sounds: Normal breath sounds. No stridor. No wheezing, rhonchi or rales.  Chest:     Chest wall: No tenderness.  Skin:    General: Skin is warm and dry.     Capillary Refill: Capillary refill takes less than 2 seconds.  Neurological:     General: No focal deficit present.     Mental Status: She is alert and oriented to person, place, and time. Mental status is at baseline.  Psychiatric:        Mood and Affect: Mood normal.        Behavior: Behavior normal.        Thought Content: Thought content normal.        Judgment: Judgment normal.    Assessment & Plan:   Problem List Items Addressed This Visit   None   Outpatient Encounter Medications as of 10/26/2020  Medication Sig    amLODipine (NORVASC) 5 MG tablet Take 1 tablet (5 mg total) by mouth daily. To lower blood pressure   benazepril (LOTENSIN) 40 MG tablet Take 1 tablet (40 mg total) by mouth daily. To lower blood pressure   famotidine (PEPCID) 20 MG tablet Take 1 tablet (20 mg total) by mouth 2 (two) times daily. To reduce stomach acid   naproxen (NAPROSYN) 375 MG tablet Take 1 tablet (375 mg total) by mouth 2 (two) times daily.   No facility-administered encounter medications on file as of 10/26/2020.    Follow-up: No follow-ups on file.   PLAN Continue current medication regimen Labs collected. Will follow up with the patient as warranted. Meloxicam for knee pain as needed Ondansetron as needed for vomiting. Suspect some cyclic vomiting syndrome. No red flags Patient encouraged to call clinic with any questions, comments, or concerns.  Janeece Agee, NP

## 2020-10-30 ENCOUNTER — Encounter: Payer: Self-pay | Admitting: Registered Nurse

## 2020-10-31 ENCOUNTER — Other Ambulatory Visit: Payer: Self-pay

## 2020-10-31 ENCOUNTER — Telehealth: Payer: Self-pay | Admitting: Registered Nurse

## 2020-10-31 DIAGNOSIS — I10 Essential (primary) hypertension: Secondary | ICD-10-CM

## 2020-10-31 MED ORDER — BENAZEPRIL HCL 40 MG PO TABS: 40.0000 mg | ORAL_TABLET | Freq: Every day | ORAL | 0 refills | Status: AC

## 2020-10-31 MED ORDER — AMLODIPINE BESYLATE 5 MG PO TABS: 5.0000 mg | ORAL_TABLET | Freq: Every day | ORAL | 0 refills | Status: AC

## 2020-10-31 NOTE — Telephone Encounter (Signed)
Called patient's son. He states patient is out of medication. Medication sent to pharmacy.

## 2020-10-31 NOTE — Telephone Encounter (Signed)
Patient states that his mother was here on 10/26/2020 - She was prescribed amlodipine and benozipril - these medications have not been sent to [facility] - Please advise.

## 2020-11-20 ENCOUNTER — Ambulatory Visit (INDEPENDENT_AMBULATORY_CARE_PROVIDER_SITE_OTHER): Payer: Self-pay | Admitting: Orthopaedic Surgery

## 2020-11-20 DIAGNOSIS — M1711 Unilateral primary osteoarthritis, right knee: Secondary | ICD-10-CM

## 2020-11-20 MED ORDER — LIDOCAINE HCL 1 % IJ SOLN
2.0000 mL | INTRAMUSCULAR | Status: AC | PRN
  Administered 2020-11-20: 2 mL

## 2020-11-20 MED ORDER — BUPIVACAINE HCL 0.25 % IJ SOLN
2.0000 mL | INTRAMUSCULAR | Status: AC | PRN
  Administered 2020-11-20: 2 mL via INTRA_ARTICULAR

## 2020-11-20 MED ORDER — DICLOFENAC SODIUM 75 MG PO TBEC: 75.0000 mg | DELAYED_RELEASE_TABLET | Freq: Two times a day (BID) | ORAL | 0 refills | Status: AC | PRN

## 2020-11-20 NOTE — Progress Notes (Signed)
Office Visit Note   Patient: Susan Russell           Date of Birth: [birth year]           MRN: [id number] Visit Date: 11/20/2020              Requested by: Janeece Agee, NP [id number] [place],  [place] [address] PCP: Janeece Agee, NP   Assessment & Plan: Visit Diagnoses:  1. Unilateral primary osteoarthritis, right knee     Plan: Impression is right knee advanced degenerative joint disease with recurrent effusion.  Today, we discussed repeat aspiration in addition to knee replacement surgery.  She would like to repeat aspiration today.  It is too early to reinject with cortisone.  She will follow-up with [place] as needed.  Follow-Up Instructions: No follow-ups on file.   Orders:  Orders Placed This Encounter  Procedures   Large Joint Inj: R knee   No orders of the defined types were placed in this encounter.     Procedures: Large Joint Inj: R knee on 11/20/2020 3:52 PM Indications: pain Details: 22 G needle, anterolateral approach Medications: 2 mL lidocaine 1 %; 2 mL bupivacaine 0.25 %     Clinical Data: No additional findings.   Subjective: Chief Complaint  Patient presents with   Right Knee - Pain    HPI patient is a pleasant 65 year old female who comes in today with recurrent right knee effusion.  She was seen by [place] a little over 2 months ago with right knee pain and swelling.  We proceeded with aspiration and cortisone injection which significantly helped.  The swelling has recently returned.  She still denies any pain, however.  She has been taking Mobic without relief of swelling.  Review of Systems as detailed in HPI.  All others reviewed and are negative.   Objective: Vital Signs: LMP 07/14/2012   Physical Exam well-developed well-nourished female in no acute distress.  Alert and oriented x3.  Ortho Exam examination of the right knee shows a moderate sized effusion.  Range of motion 0 to 90 degrees.  No joint line tenderness.   Ligaments are stable.  She is neurovascular intact distally.  Specialty Comments:  No specialty comments available.  Imaging: No new imaging   PMFS History: Patient Active Problem List   Diagnosis Date Noted   Bilious vomiting with nausea 10/26/2020   Chronic pain of right knee 10/26/2020   HYPOKALEMIA 09/18/2009   ALLERGIC RHINITIS CAUSE UNSPECIFIED 09/17/2009   MITRAL REGURGITATION, MILD 06/11/2009   NEPHROLITHIASIS, HX OF 05/24/2009   DEPRESSION, MILD 03/02/2009   GERD 03/02/2009   CHEST PAIN UNSPECIFIED 03/02/2009   MURMUR 12/29/2008   HYPERTENSION 07/05/2007   PAIN IN JOINT, HAND 05/21/2007   ROTATOR CUFF SYNDROME, LEFT 05/21/2007   Past Medical History:  Diagnosis Date   Glaucoma    History of prediabetes    Patient had hemoglobin A1c of 6.3 in 2015 per chart   Hyperlipidemia    Hypertension     Family History  Problem Relation Age of Onset   Vascular Disease Mother     Past Surgical History:  Procedure Laterality Date   EYE SURGERY     OVARIAN CYST REMOVAL     TUBAL LIGATION     Social History   Occupational History   Not on file  Tobacco Use   Smoking status: Never   Smokeless tobacco: Never  Substance and Sexual Activity   Alcohol use: No  Drug use: No   Sexual activity: Yes    Birth control/protection: Surgical
